# Patient Record
Sex: Female | Born: 1965 | Race: White | Hispanic: No | Marital: Single | Smoking: Never smoker
Health system: Southern US, Community
[De-identification: ages and names within clinical notes are randomized; demographics above are authoritative.]

## PROBLEM LIST (undated history)

## (undated) DIAGNOSIS — G4700 Insomnia, unspecified: Secondary | ICD-10-CM

## (undated) DIAGNOSIS — F32A Depression, unspecified: Secondary | ICD-10-CM

## (undated) DIAGNOSIS — E785 Hyperlipidemia, unspecified: Secondary | ICD-10-CM

## (undated) DIAGNOSIS — R1310 Dysphagia, unspecified: Secondary | ICD-10-CM

## (undated) DIAGNOSIS — G894 Chronic pain syndrome: Secondary | ICD-10-CM

## (undated) DIAGNOSIS — E041 Nontoxic single thyroid nodule: Secondary | ICD-10-CM

## (undated) DIAGNOSIS — I10 Essential (primary) hypertension: Secondary | ICD-10-CM

## (undated) DIAGNOSIS — K5900 Constipation, unspecified: Secondary | ICD-10-CM

## (undated) DIAGNOSIS — E0590 Thyrotoxicosis, unspecified without thyrotoxic crisis or storm: Secondary | ICD-10-CM

## (undated) DIAGNOSIS — G600 Hereditary motor and sensory neuropathy: Secondary | ICD-10-CM

## (undated) DIAGNOSIS — N2889 Other specified disorders of kidney and ureter: Secondary | ICD-10-CM

## (undated) DIAGNOSIS — G118 Other hereditary ataxias: Secondary | ICD-10-CM

## (undated) DIAGNOSIS — D649 Anemia, unspecified: Secondary | ICD-10-CM

## (undated) HISTORY — PX: GREAT TOE ARTHRODESIS, INTERPHALANGEAL JOINT: SUR55

## (undated) HISTORY — PX: ORIF HIP FRACTURE: SHX2125

## (undated) HISTORY — PX: HERNIA REPAIR: SHX51

---

## 2009-09-29 ENCOUNTER — Ambulatory Visit: Payer: Self-pay | Admitting: Family Medicine

## 2009-09-29 DIAGNOSIS — R531 Weakness: Secondary | ICD-10-CM

## 2009-10-02 ENCOUNTER — Encounter: Payer: Self-pay | Admitting: Family Medicine

## 2009-10-03 LAB — CONVERTED CEMR LAB
Albumin: 4.4 g/dL (ref 3.5–5.2)
Chloride: 102 meq/L (ref 96–112)
Creatinine, Ser: 0.7 mg/dL (ref 0.40–1.20)
HCT: 39.2 % (ref 36.0–46.0)
HDL: 62 mg/dL (ref >39)
Hemoglobin: 12.4 g/dL (ref 12.0–15.0)
LDL Cholesterol: 87 mg/dL (ref 0–99)
MCHC: 31.6 g/dL (ref 30.0–36.0)
Platelets: 286 10*3/uL (ref 150–400)
Potassium: 4.4 meq/L (ref 3.5–5.3)
RDW: 13 % (ref 11.5–15.5)
Sodium: 138 meq/L (ref 135–145)
TSH: 0.671 microintl units/mL (ref 0.350–4.500)

## 2009-10-22 ENCOUNTER — Encounter: Admission: RE | Admit: 2009-10-22 | Discharge: 2009-10-22 | Payer: Self-pay | Admitting: Family Medicine

## 2009-10-27 ENCOUNTER — Ambulatory Visit: Payer: Self-pay | Admitting: Family Medicine

## 2009-10-27 ENCOUNTER — Other Ambulatory Visit: Admission: RE | Admit: 2009-10-27 | Discharge: 2009-10-27 | Payer: Self-pay | Admitting: Family Medicine

## 2009-10-30 LAB — CONVERTED CEMR LAB
Pap Smear: NEGATIVE
Pap Smear: NORMAL

## 2009-11-14 ENCOUNTER — Encounter: Payer: Self-pay | Admitting: Family Medicine

## 2009-11-14 DIAGNOSIS — A5217 General paresis: Secondary | ICD-10-CM

## 2009-12-16 ENCOUNTER — Encounter: Payer: Self-pay | Admitting: Family Medicine

## 2010-02-12 ENCOUNTER — Encounter: Payer: Self-pay | Admitting: Family Medicine

## 2010-03-31 NOTE — Assessment & Plan Note (Signed)
Summary: NOV: LE weakness   Vital Signs:  Patient profile:   45 year old female Height:      65 inches Weight:      112 pounds BMI:     18.71 Pulse rate:   81 / minute BP sitting:   135 / 80  (left arm) Cuff size:   regular  Vitals Entered By: Avon Gully CMA, Duncan Dull) (September 29, 2009 2:23 PM) CC: NP est care   Primary Care Provider:  Nani Gasser MD  CC:  NP est care.  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 Older brother was dx with this.  Can use crutches. Able to do bathing.  Haing a harder time with that. ABle to go to the kitchen table.  If gets out uses hr wheelchair.  3 of 5 siblings have this d/o.  Lives with her parents. but they are intheir 70s.  Does do some leg stretches. started using a cane in her late 66s. She is here with her sister today.   Hs been having regular periods.  Never been sexually active.  Never had a mammogram.  Occ gets dizzy with her periods.  Decreased appetite.  Has lost some weight recently.   Habits & Providers  Alcohol-Tobacco-Diet     Alcohol drinks/day: 0     Tobacco Status: never  Exercise-Depression-Behavior     Does Patient Exercise: no     STD Risk: never     Drug Use: no     Seat Belt Use: always  Current Medications (verified): 1)  Multivitamins  Tabs (Multiple Vitamin) .Marland Kitchen.. 1 Tablet By Mouth Daily 2)  Vitamin D3 400 Unit Tabs (Cholecalciferol) .... On E Tablet By Mouth Daily 3)  Vitamin B-12 100 Mcg Tabs (Cyanocobalamin) .... Once Daily  Allergies (verified): No Known Drug Allergies  Comments:  Nurse/Medical Assistant: The patient's medications and allergies were reviewed with the patient and were updated in the Medication and Allergy Lists. Avon Gully CMA, Duncan Dull) (September 29, 2009 2:28 PM)  Past History:  Past Medical History: None  Past Surgical History: surgery to lengthen toes, 1980s Hernia early 68s   Family History: Grandparent wth MI, DM Father with Hi chol,  HTN Brothe wtih hi chol, HTN.  2 brothers with Charcot Alen Bleacher  Social History: Completed 10thgrade. Single.  Never Smoked Alcohol use-no Drug use-no Regular exercise-no Smoking Status:  never STD Risk:  never Seat Belt Use:  always Drug Use:  no Does Patient Exercise:  no  Review of Systems       No fever/sweats/weakness, unexplained weight loss/gain.  No vison changes.  No difficulty hearing/ringing in ears, hay fever/allergies.  No chest pain/discomfort, palpitations.  No Br lump/nipple discharge.  No cough/wheeze.  No blood in BM, nausea/vomiting/diarrhea.  No nighttime urination, leaking urine, unusual vaginal bleeding, discharge (penis or vagina).  No muscle/joint pain. No rash, change in mole.  No HA, memory loss.  No anxiety, sleep d/o, depression.  + easy bruising/bleeding, unexplained lump   Physical Exam  General:  Well-developed,well-nourished,in no acute distress; alert,appropriate and cooperative throughout examination Head:  Normocephalic and atraumatic without obvious abnormalities. No apparent alopecia or balding. Lungs:  Normal respiratory effort, chest expands symmetrically. Lungs are clear to auscultation, no crackles or wheezes. Heart:  Normal rate and regular rhythm. S1 and S2 normal without gallop, murmur, click, rub or other extra sounds. Msk:  UE with NROM and strength. LE with Hip adn knee strength 2/5 with the left hip  and knee being weaker. Starting to develope a contract in teh left leg. Had to push to get her left knee flexed again.  Unable to extend the ankles. Can flex weakly.  Atropy of the leg muscles bilat.   Extremities:  No LE edema.  Skin:  no rashes.   Psych:  Cognition and judgment appear intact. Alert and cooperative with normal attention span and concentration. 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  Their are no treatments for this and her brother is now in a nursing home. Discussed that we could refer to Neurology for further evaluation. Also discussed that PT would be helpful for her. Encouraged her to do her exercises daily.   Also recommen screening baseline labs.  Pt will think about Neuro referral.  Also recommend she get her mammogram as has never had one.  Orders: T-CBC No Diff ([id number]) T-Comprehensive Metabolic Panel ([id number]) T-TSH [id number]) T-Lipid Profile ([id number])  Complete Medication List: 1)  Multivitamins Tabs (Multiple vitamin) .Marland Kitchen.. 1 tablet by mouth daily 2)  Vitamin D3 400 Unit Tabs (Cholecalciferol) .... On e tablet by mouth daily 3)  Vitamin B-12 100 Mcg Tabs (Cyanocobalamin) .... Once daily  Patient Instructions: 1)  Please schedule a physical with me.   2)  We will call your lab results.  3)  Think about the Physical Therapy and Neurology referral

## 2010-03-31 NOTE — Assessment & Plan Note (Signed)
Summary: CPE with Pap   Vital Signs:  Patient profile:   45 year old female LMP:     10/23/2009 Height:      65 inches Pulse rate:   82 / minute BP sitting:   119 / 75  (left arm) Cuff size:   regular  Vitals Entered By: Kathlene November (October 27, 2009 9:30 AM) CC: CPE with pap LMP (date): 10/23/2009     Enter LMP: 10/23/2009   Primary Care Provider:  Nani Gasser MD  CC:  CPE with pap.  History of Present Illness: Here for CPE today.  Has normal periods.  WEnt for her mammogram last week and did well.   Says she is not interested in seeing a Neurologist at this time. She thinks she would like to have homePT.   Current Medications (verified): 1)  Multivitamins  Tabs (Multiple Vitamin) .Marland Kitchen.. 1 Tablet By Mouth Daily 2)  Vitamin D3 400 Unit Tabs (Cholecalciferol) .... On E Tablet By Mouth Daily 3)  Vitamin B-12 100 Mcg Tabs (Cyanocobalamin) .... Once Daily  Allergies (verified): No Known Drug Allergies  Comments:  Nurse/Medical Assistant: The patient's medications and allergies were reviewed with the patient and were updated in the Medication and Allergy Lists. Kathlene November (October 27, 2009 9:31 AM)  Past History:  Past Medical History: Last updated: 09/29/2009 None  Past Surgical History: Last updated: 09/29/2009 surgery to lengthen toes, 1980s Hernia early 46s   Family History: Last updated: 09/29/2009 Grandparent wth MI, DM Father with Hi chol, HTN Brothe wtih hi chol, HTN.  2 brothers with Charcot Alen Bleacher  Social History: Last updated: 09/29/2009 Completed 10thgrade. Single.  Never Smoked Alcohol use-no Drug use-no Regular exercise-no  Review of Systems  The patient denies anorexia, fever, weight loss, weight gain, vision loss, decreased hearing, hoarseness, chest pain, syncope, dyspnea on exertion, peripheral edema, prolonged cough, headaches, hemoptysis, abdominal pain, melena, hematochezia, severe indigestion/heartburn, hematuria,  incontinence, genital sores, muscle weakness, suspicious skin lesions, transient blindness, difficulty walking, depression, unusual weight change, abnormal bleeding, enlarged lymph nodes, and breast masses.    Physical Exam  General:  Well-developed,well-nourished,in no acute distress; alert,appropriate and cooperative throughout examination Head:  Normocephalic and atraumatic without obvious abnormalities. No apparent alopecia or balding. Eyes:  No corneal or conjunctival inflammation noted. EOMI. Perrla. Ears:  External ear exam shows no significant lesions or deformities.  Otoscopic examination reveals clear canals, tympanic membranes are intact bilaterally without bulging, retraction, inflammation or discharge. Hearing is grossly normal bilaterally. Nose:  External nasal examination shows no deformity or inflammation.  Mouth:  Oral mucosa and oropharynx without lesions or exudates.  Teeth in good repair. Neck:  No deformities, masses, or tenderness noted. Chest Wall:  No deformities, masses, or tenderness noted. Breasts:  No mass, nodules, thickening, tenderness, bulging, retraction, inflamation, nipple discharge or skin changes noted.   Lungs:  Normal respiratory effort, chest expands symmetrically. Lungs are clear to auscultation, no crackles or wheezes. Heart:  Normal rate and regular rhythm. S1 and S2 normal without gallop, murmur, click, rub or other extra sounds. Abdomen:  Bowel sounds positive,abdomen soft and non-tender without masses, organomegaly or hernias noted. Genitalia:  normal introitus, no external lesions, no vaginal discharge, mucosa pink and moist, no vaginal atrophy, and no adnexal masses or tenderness.  Uterus felt slightly enlarged.  Msk:  No deformity or scoliosis noted of thoracic or lumbar spine.   Extremities:  No clubbing, cyanosis, edema, or deformity noted with normal full range of motion of all  joints.   Neurologic:  No cranial nerve deficits noted. Station and  gait are normal.  Sensory, motor and coordinative functions appear intact. Skin:  no rashes.   Cervical Nodes:  No lymphadenopathy noted Axillary Nodes:  No palpable lymphadenopathy Psych:  Cognition and judgment appear intact. Alert and cooperative with normal attention span and concentration. No apparent delusions, illusions, hallucinations   Impression & Recommendations:  Problem # 1:  ROUTINE GYNECOLOGICAL EXAMINATION (ICD-V72.31) Mammogram up to date Discussed Tdap and flu shot F/U pap results. HPV added. She is not sexually active.  If normal then consider [place] of teh uterus to evaluate for enlarged size.  REviewede her lab results.   Complete Medication List: 1)  Multivitamins Tabs (Multiple vitamin) .Marland Kitchen.. 1 tablet by mouth daily 2)  Vitamin D3 400 Unit Tabs (Cholecalciferol) .... On e tablet by mouth daily 3)  Vitamin B-12 100 Mcg Tabs (Cyanocobalamin) .... Once daily  Other Orders: Physical Therapy Referral (PT)  Patient Instructions: 1)  Still think about getting home Physical Therapy.  2)  We will call you with your pap smear results.   Appended Document: CPE with Pap

## 2010-03-31 NOTE — Miscellaneous (Signed)
Summary: Care Plan/[facility]  Care Plan/[facility]   Imported By: Lanelle Bal 12/25/2009 11:19:28  _____________________________________________________________________  External Attachment:    Type:   Image     Comment:   External Document

## 2010-03-31 NOTE — Miscellaneous (Signed)
Summary: Face to Face Encounter Form/[facility]  Face to Face Encounter Form/[facility]   Imported By: Lanelle Bal 11/26/2009 14:05:20  _____________________________________________________________________  External Attachment:    Type:   Image     Comment:   External Document

## 2010-04-02 NOTE — Miscellaneous (Signed)
Summary: Face to Face Encounter Form/[facility]  Face to Face Encounter Form/[facility]   Imported By: Lanelle Bal 02/17/2010 09:37:51  _____________________________________________________________________  External Attachment:    Type:   Image     Comment:   External Document

## 2013-10-22 DIAGNOSIS — G1111 Friedreich ataxia: Secondary | ICD-10-CM | POA: Diagnosis not present

## 2014-11-11 ENCOUNTER — Other Ambulatory Visit: Payer: Self-pay | Admitting: Family Medicine

## 2014-11-11 ENCOUNTER — Encounter: Payer: Self-pay | Admitting: Family Medicine

## 2014-11-11 ENCOUNTER — Ambulatory Visit (INDEPENDENT_AMBULATORY_CARE_PROVIDER_SITE_OTHER): Payer: Medicare Other | Admitting: Family Medicine

## 2014-11-11 VITALS — BP 121/64 | HR 79 | Temp 97.8°F

## 2014-11-11 DIAGNOSIS — R531 Weakness: Secondary | ICD-10-CM | POA: Diagnosis not present

## 2014-11-11 DIAGNOSIS — G118 Other hereditary ataxias: Secondary | ICD-10-CM

## 2014-11-11 DIAGNOSIS — G600 Hereditary motor and sensory neuropathy: Secondary | ICD-10-CM

## 2014-11-11 DIAGNOSIS — Z13220 Encounter for screening for lipoid disorders: Secondary | ICD-10-CM

## 2014-11-11 DIAGNOSIS — Z23 Encounter for immunization: Secondary | ICD-10-CM

## 2014-11-11 DIAGNOSIS — H547 Unspecified visual loss: Secondary | ICD-10-CM | POA: Insufficient documentation

## 2014-11-11 DIAGNOSIS — D509 Iron deficiency anemia, unspecified: Secondary | ICD-10-CM | POA: Diagnosis not present

## 2014-11-11 DIAGNOSIS — Z8249 Family history of ischemic heart disease and other diseases of the circulatory system: Secondary | ICD-10-CM | POA: Diagnosis not present

## 2014-11-11 NOTE — Progress Notes (Signed)
Subjective:    Patient ID: Sharon Bruce, female    DOB: 01-18-1966, 49 y.o.   MRN: [id number]  HPI  Sharon Bruce is here to reestablish care today. I last saw her 5 years ago. 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 She's at the point where she is having difficulty caring for her. Fact they actually brought in [facility] 2 forms to be completed. She does take a few supplements including B12, B6 and folic acid as well as a multivitamin. She reports that her last mammogram was about 2 years ago done in [place]. Her last Pap smear was about 5 years ago.  She is primarily will chair bound. She can take maybe 2 steps with some assistance. She is not able to bear weight on her legs on her own. She is able to dress herself most days but does seem to need assistance occasionally reticulated and putting on her underwear. She does have especially outfitted handicap shower with bars and hand-held shower head. She is able to shower and clean independently. She is able to eat independently but does require food preparation by her mother. At home she does use a manual wheelchair which she propels herself. She reports normal bladder and bowel function. She has good strength with her hands bilaterally but occasionally experiences sharp cramping in the hands and has to use her opposite hand to open her fist.  She does have an occasional dry cough which seems to be worse after she eats. This has been long-term and is felt to be secondary to her disease processes.   Review of Systems  Constitutional: Negative for fever, diaphoresis and unexpected weight change.  HENT: Negative for hearing loss, rhinorrhea and tinnitus.   Eyes: Negative for visual disturbance.  Respiratory: Negative for cough and wheezing.   Cardiovascular: Negative for chest pain and palpitations.  Gastrointestinal: Negative for nausea,  vomiting, diarrhea and blood in stool.  Genitourinary: Negative for vaginal bleeding, vaginal discharge and difficulty urinating.  Musculoskeletal: Negative for myalgias and arthralgias.  Skin: Negative for rash.  Neurological: Negative for headaches.  Hematological: Negative for adenopathy. Does not bruise/bleed easily.  Psychiatric/Behavioral: Negative for sleep disturbance and dysphoric mood. The patient is not nervous/anxious.    BP 121/64 mmHg  Pulse 79  Temp(Src) 97.8 F (36.6 C)  Wt   LMP 11/04/2014    No Known Allergies  History reviewed. No pertinent past medical history.  Past Surgical History  Procedure Laterality Date  . Hernia repair      as a child, groin hernia    Social History   Social History  . Marital Status: Single    Spouse Name: N/A  . Number of Children: N/A  . Years of Education: 10th grade   Occupational History  . unemployed    Social History Main Topics  . Smoking status: Never Smoker   . Smokeless tobacco: Not on file  . Alcohol Use: No  . Drug Use: No  . Sexual Activity: No   Other Topics Concern  . Not on file   Social History Narrative   Wheelchair bound. Disabled.      Family History  Problem Relation Age of Onset  . Hypertension Father   . Hypertension Brother   . Charcot-Marie-Tooth disease Brother   . Heart disease Father     Outpatient Encounter Prescriptions as of 11/11/2014  Medication Sig  . B Complex  Vitamins (B COMPLEX PO) Take by mouth.  . Cholecalciferol (VITAMIN D3) 1000 UNITS CAPS Take 1,000 Units by mouth.  . Multiple Vitamin (VITAMIN E/FOLIC ACID/B-6/B-12) CAPS Take by mouth.   No facility-administered encounter medications on file as of 11/11/2014.           Objective:   Physical Exam  Constitutional: She is oriented to person, place, and time. She appears well-developed and well-nourished.  Sitting in wheelchair  HENT:  Head: Normocephalic and atraumatic.  Right Ear: External ear normal.   Left Ear: External ear normal.  Nose: Nose normal.  Mouth/Throat: Oropharynx is clear and moist.  TMs and canals are clear.   Eyes: Conjunctivae and EOM are normal. Pupils are equal, round, and reactive to light.  Neck: Neck supple. No thyromegaly present.  Cardiovascular: Normal rate, regular rhythm and normal heart sounds.   Pulmonary/Chest: Effort normal and breath sounds normal. She has no wheezes.  Lymphadenopathy:    She has no cervical adenopathy.  Neurological: She is alert and oriented to person, place, and time.  Skin: Skin is warm and dry.  Psychiatric: She has a normal mood and affect. Her behavior is normal.          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 She is wheelchair-bound. [place] for her in case her mother is not able to take care of her in the near future which is very possible. As her mom is aging out and having a more difficult struggles being a primary caretaker for her.  Poor vision-will refer to optometry for further evaluation. 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 It is been years per her report since she was actually seen by a vision specialist.  Due for mammogram. She says she will plan to schedule along with her sister's visit in [place] has special equipment that can be used.  Discussed need for tetanus vaccine She prefers to  hold off today as she is wanting to do the flu for now.  Discuss getting a CMP and lipid panel. She plans to schedule another mammogram in [place] so that her sister can go at the same time. It sounds like they may have some special equipment to allow her to do her mammogram without having to stand.  Flu vaccine given today.

## 2014-11-12 LAB — COMPLETE METABOLIC PANEL WITH GFR
ALT: 11 U/L (ref 6–29)
AST: 13 U/L (ref 10–35)
Albumin: 4 g/dL (ref 3.6–5.1)
Alkaline Phosphatase: 52 U/L (ref 33–115)
BILIRUBIN TOTAL: 1 mg/dL (ref 0.2–1.2)
BUN: 11 mg/dL (ref 7–25)
CHLORIDE: 102 mmol/L (ref 98–110)
CO2: 29 mmol/L (ref 20–31)
Calcium: 9.4 mg/dL (ref 8.6–10.2)
Creat: 0.67 mg/dL (ref 0.50–1.10)
GLUCOSE: 87 mg/dL (ref 65–99)
POTASSIUM: 4.3 mmol/L (ref 3.5–5.3)
SODIUM: 140 mmol/L (ref 135–146)
TOTAL PROTEIN: 6.7 g/dL (ref 6.1–8.1)

## 2014-11-12 LAB — LIPID PANEL
CHOL/HDL RATIO: 2.6 ratio (ref <=5.0)
Cholesterol: 164 mg/dL (ref 125–200)
HDL: 62 mg/dL (ref >=46)
LDL CALC: 91 mg/dL (ref <130)
TRIGLYCERIDES: 54 mg/dL (ref <150)
VLDL: 11 mg/dL (ref <30)

## 2014-11-12 LAB — CBC
HCT: 36.5 % (ref 36.0–46.0)
HEMOGLOBIN: 11.9 g/dL — AB (ref 12.0–15.0)
MCH: 29.3 pg (ref 26.0–34.0)
MCHC: 32.6 g/dL (ref 30.0–36.0)
MCV: 89.9 fL (ref 78.0–100.0)
MPV: 10.4 fL (ref 8.6–12.4)
Platelets: 298 10*3/uL (ref 150–400)
RBC: 4.06 MIL/uL (ref 3.87–5.11)
RDW: 13.2 % (ref 11.5–15.5)
WBC: 6.8 10*3/uL (ref 4.0–10.5)

## 2014-11-12 LAB — VITAMIN D 25 HYDROXY (VIT D DEFICIENCY, FRACTURES): VIT D 25 HYDROXY: 29 ng/mL — AB (ref 30–100)

## 2014-11-13 LAB — VITAMIN B12: VITAMIN B 12: 899 pg/mL (ref 211–911)

## 2014-11-13 LAB — FERRITIN: Ferritin: 16 ng/mL (ref 10–291)

## 2014-11-28 ENCOUNTER — Telehealth: Payer: Self-pay

## 2014-11-28 NOTE — Telephone Encounter (Signed)
Patient is calling about a FL2 form. Has it been sent.

## 2014-12-28 DIAGNOSIS — Z9181 History of falling: Secondary | ICD-10-CM | POA: Diagnosis not present

## 2014-12-28 DIAGNOSIS — S7291XA Unspecified fracture of right femur, initial encounter for closed fracture: Secondary | ICD-10-CM | POA: Diagnosis not present

## 2014-12-28 DIAGNOSIS — R293 Abnormal posture: Secondary | ICD-10-CM | POA: Diagnosis not present

## 2014-12-28 DIAGNOSIS — R269 Unspecified abnormalities of gait and mobility: Secondary | ICD-10-CM | POA: Diagnosis not present

## 2014-12-28 DIAGNOSIS — S72001A Fracture of unspecified part of neck of right femur, initial encounter for closed fracture: Secondary | ICD-10-CM

## 2014-12-28 DIAGNOSIS — S72011A Unspecified intracapsular fracture of right femur, initial encounter for closed fracture: Secondary | ICD-10-CM | POA: Diagnosis present

## 2014-12-28 DIAGNOSIS — G3281 Cerebellar ataxia in diseases classified elsewhere: Secondary | ICD-10-CM | POA: Diagnosis not present

## 2014-12-28 DIAGNOSIS — R8271 Bacteriuria: Secondary | ICD-10-CM | POA: Diagnosis not present

## 2014-12-28 DIAGNOSIS — Z7901 Long term (current) use of anticoagulants: Secondary | ICD-10-CM | POA: Diagnosis not present

## 2014-12-28 DIAGNOSIS — S72091A Other fracture of head and neck of right femur, initial encounter for closed fracture: Secondary | ICD-10-CM | POA: Diagnosis not present

## 2014-12-28 DIAGNOSIS — W0110XA Fall on same level from slipping, tripping and stumbling with subsequent striking against unspecified object, initial encounter: Secondary | ICD-10-CM | POA: Diagnosis not present

## 2014-12-28 DIAGNOSIS — D649 Anemia, unspecified: Secondary | ICD-10-CM | POA: Diagnosis present

## 2014-12-28 DIAGNOSIS — G118 Other hereditary ataxias: Secondary | ICD-10-CM | POA: Diagnosis not present

## 2014-12-28 DIAGNOSIS — R29898 Other symptoms and signs involving the musculoskeletal system: Secondary | ICD-10-CM | POA: Diagnosis not present

## 2014-12-28 DIAGNOSIS — Z993 Dependence on wheelchair: Secondary | ICD-10-CM | POA: Diagnosis not present

## 2014-12-28 DIAGNOSIS — Z96641 Presence of right artificial hip joint: Secondary | ICD-10-CM | POA: Diagnosis not present

## 2014-12-28 DIAGNOSIS — S72101A Unspecified trochanteric fracture of right femur, initial encounter for closed fracture: Secondary | ICD-10-CM | POA: Diagnosis not present

## 2014-12-28 DIAGNOSIS — R1312 Dysphagia, oropharyngeal phase: Secondary | ICD-10-CM | POA: Diagnosis not present

## 2014-12-28 DIAGNOSIS — G111 Early-onset cerebellar ataxia: Secondary | ICD-10-CM | POA: Diagnosis not present

## 2014-12-28 DIAGNOSIS — M25551 Pain in right hip: Secondary | ICD-10-CM | POA: Diagnosis not present

## 2014-12-28 DIAGNOSIS — M6281 Muscle weakness (generalized): Secondary | ICD-10-CM | POA: Diagnosis not present

## 2014-12-28 HISTORY — PX: HEMIARTHROPLASTY (BIPOLAR), HIP, DIRECT ANTERIOR APPROACH, FOR FRACTURE: SHX7584

## 2014-12-28 HISTORY — DX: Fracture of unspecified part of neck of right femur, initial encounter for closed fracture: S72.001A

## 2015-01-03 NOTE — Progress Notes (Signed)
 [facility] Physical Therapy - Treatment  Patient Name:  Sharon Bruce [facility] Date of Birth:  May 08, 1965  Today's Date: January 03, 2015  Assessment / Plan   Clinical Assessment: Carry over at 20-30% accuracy for cognition and motor cues/strategies by patient. Sister present and actively engaged with concurrent education on modifying movement patterns of UEs and LEs; patient is very responsive to verbal and tactile cues for minimizing tone and increased motor control/coordination. Pt. responds well to purposful movements, activity play, rythmic motion, and music (provided by sister on smart device) Pt. tolerates extended activity time and is limited by R hip pain with LE activities/exercises. PT. would benefit from IP rehab to maximize functional independence and lessen burden of care. PT Problem List: Decline in ambulation;Decline in safety awareness;Decreased ADL function;Decreased functional mobility;Decreased activity tolerance;Decreased handling tolerance;Fall risk;Generalized weakness;Impaired posture;Impaired balance;Impaired sensory processing Prognosis: Fair;Good     Recommendations  PT Recommendations: SNF Rehab consult;24 hour supervision/assist;IP Rehab facility consult PT Equipment Recommended: Hospital Bed  Pertinent Information: Pt. Side-lying in bed with pillow positioned between flexed knees. Positional changes encouraged with family and staff. Pt. Has all needs within reach. Mother and sister are present in room for additional needs. Subjective  Pt. with RN/CNA for pericare. Sister present and given permission to discuss PT by patient. Pt. is agreeable to PT session   Objective  Precautions  Hip Precautions: Anterior (direct anterior (no precautions indicated)) Other Precautions: Ant. Hip/tone/extensor patterns Precautions discussed with:: Patient;Parent(s);Support person  Pain 2 on a scale of 10.  Location: Right  hip Interventions Treatment Interventions: Cognitive Retraining;Functional Therapeutic activity;Patient/caregiver education;Bed Mobility Training;Miscellaneous Neuromuscular re-education: Muscle tone management;Gross motor coordination;Trunk control;Crossing midline activities;Bilateral coordination;PNF patterns Sensory Integration: Dynamic joint positioning;Static joint positioning     Goals  PT - Patient/Support Person Stated Goals: Pt. falling asleep at end of session; family present, friend present.     Physical Therapy Care Plan       Problem: Balance, Impaired    Dates: Start: 01/01/15      Goal: Balance    Dates: Start: 01/01/15   Expected End: 01/08/15     Description: Patient will maintain sitting while propped on the UE with good stability for up to 5 minutes with light assistance and prepositioning.     Outcomes: Date/Time User Outcome  01/02/15 1615 Adine JONELLE Sharps, PTA Progressing        Problem: Mobility - Impaired    Dates: Start: 01/01/15      Goal: Bed Mobility    Dates: Start: 01/01/15   Expected End: 01/08/15     Description: Patient will perform bed mobility with rolling to the left side and assisting to push up to sit with mod A.    Outcomes: Date/Time User Outcome  01/02/15 1615 Adine JONELLE Sharps, PTA Progressing       Goal: Transfers    Dates: Start: 01/01/15   Expected End: 01/08/15     Description: Patient will perform bed to chair transfer with mod A of one person.    Outcomes: Date/Time User Outcome  01/02/15 1615 Adine JONELLE Sharps, PTA Progressing          Key (I=independent, ModI=modified independent, S=supervision, CGA=contact guard assist, Min=minimal assist, Mod=moderate assist, Max=maximal assist, D= dependent)   Treatment Time  Today's Treatment: 1258 - 1353 Total Time: 55 min Treatment Day: 3   Charges  Total Time Code Treatment Minutes: 55  Therapeutic Charges $ Neuromuscular Re-education: 4 units  Adine JONELLE Sharps,  PTA  01/03/2015 3:04 PM

## 2015-01-04 DIAGNOSIS — R29898 Other symptoms and signs involving the musculoskeletal system: Secondary | ICD-10-CM | POA: Diagnosis not present

## 2015-01-04 DIAGNOSIS — G111 Early-onset cerebellar ataxia: Secondary | ICD-10-CM | POA: Diagnosis not present

## 2015-01-04 DIAGNOSIS — G119 Hereditary ataxia, unspecified: Secondary | ICD-10-CM | POA: Diagnosis not present

## 2015-01-04 DIAGNOSIS — M6281 Muscle weakness (generalized): Secondary | ICD-10-CM | POA: Diagnosis not present

## 2015-01-04 DIAGNOSIS — Z96641 Presence of right artificial hip joint: Secondary | ICD-10-CM | POA: Diagnosis not present

## 2015-01-04 DIAGNOSIS — Z7901 Long term (current) use of anticoagulants: Secondary | ICD-10-CM | POA: Diagnosis not present

## 2015-01-04 DIAGNOSIS — R1310 Dysphagia, unspecified: Secondary | ICD-10-CM | POA: Diagnosis not present

## 2015-01-04 DIAGNOSIS — K5900 Constipation, unspecified: Secondary | ICD-10-CM | POA: Diagnosis not present

## 2015-01-04 DIAGNOSIS — G118 Other hereditary ataxias: Secondary | ICD-10-CM | POA: Diagnosis not present

## 2015-01-04 DIAGNOSIS — R293 Abnormal posture: Secondary | ICD-10-CM | POA: Diagnosis not present

## 2015-01-04 DIAGNOSIS — E440 Moderate protein-calorie malnutrition: Secondary | ICD-10-CM | POA: Diagnosis not present

## 2015-01-04 DIAGNOSIS — R8271 Bacteriuria: Secondary | ICD-10-CM | POA: Diagnosis not present

## 2015-01-04 DIAGNOSIS — R269 Unspecified abnormalities of gait and mobility: Secondary | ICD-10-CM | POA: Diagnosis not present

## 2015-01-04 DIAGNOSIS — D649 Anemia, unspecified: Secondary | ICD-10-CM | POA: Diagnosis not present

## 2015-01-04 DIAGNOSIS — R1312 Dysphagia, oropharyngeal phase: Secondary | ICD-10-CM | POA: Diagnosis not present

## 2015-01-04 DIAGNOSIS — Z9181 History of falling: Secondary | ICD-10-CM | POA: Diagnosis not present

## 2015-01-04 DIAGNOSIS — S72001A Fracture of unspecified part of neck of right femur, initial encounter for closed fracture: Secondary | ICD-10-CM | POA: Diagnosis not present

## 2015-01-04 DIAGNOSIS — S31815A Open bite of right buttock, initial encounter: Secondary | ICD-10-CM | POA: Diagnosis not present

## 2015-01-04 DIAGNOSIS — G3281 Cerebellar ataxia in diseases classified elsewhere: Secondary | ICD-10-CM | POA: Diagnosis not present

## 2015-01-04 DIAGNOSIS — S72101A Unspecified trochanteric fracture of right femur, initial encounter for closed fracture: Secondary | ICD-10-CM | POA: Diagnosis not present

## 2015-01-05 DIAGNOSIS — D649 Anemia, unspecified: Secondary | ICD-10-CM | POA: Diagnosis not present

## 2015-01-05 DIAGNOSIS — G119 Hereditary ataxia, unspecified: Secondary | ICD-10-CM | POA: Diagnosis not present

## 2015-01-05 DIAGNOSIS — R1310 Dysphagia, unspecified: Secondary | ICD-10-CM | POA: Diagnosis not present

## 2015-01-05 DIAGNOSIS — S31815A Open bite of right buttock, initial encounter: Secondary | ICD-10-CM | POA: Diagnosis not present

## 2015-01-05 DIAGNOSIS — K5900 Constipation, unspecified: Secondary | ICD-10-CM | POA: Diagnosis not present

## 2015-01-05 DIAGNOSIS — E440 Moderate protein-calorie malnutrition: Secondary | ICD-10-CM | POA: Diagnosis not present

## 2015-01-27 ENCOUNTER — Telehealth: Payer: Self-pay

## 2015-01-27 NOTE — Telephone Encounter (Signed)
Patient's mom called and states they still having received the FL2 form. Please advise.   [phone] [facility].

## 2015-01-27 NOTE — Telephone Encounter (Signed)
Patient's mom advised 

## 2015-01-27 NOTE — Telephone Encounter (Signed)
I am not sure.  I was completed when she was here in September. I am not sure when and where it was faxed. I'll be happy to fill out another form if we cannot locate it. They can drop it off anytime.

## 2015-03-03 DIAGNOSIS — G3281 Cerebellar ataxia in diseases classified elsewhere: Secondary | ICD-10-CM | POA: Diagnosis not present

## 2015-03-03 DIAGNOSIS — E43 Unspecified severe protein-calorie malnutrition: Secondary | ICD-10-CM | POA: Diagnosis not present

## 2015-03-26 DIAGNOSIS — M25551 Pain in right hip: Secondary | ICD-10-CM | POA: Diagnosis not present

## 2015-06-05 DIAGNOSIS — E039 Hypothyroidism, unspecified: Secondary | ICD-10-CM | POA: Diagnosis not present

## 2015-07-02 DIAGNOSIS — M79672 Pain in left foot: Secondary | ICD-10-CM | POA: Diagnosis not present

## 2015-07-02 DIAGNOSIS — M79671 Pain in right foot: Secondary | ICD-10-CM | POA: Diagnosis not present

## 2015-07-02 DIAGNOSIS — B351 Tinea unguium: Secondary | ICD-10-CM | POA: Diagnosis not present

## 2015-07-18 DIAGNOSIS — E039 Hypothyroidism, unspecified: Secondary | ICD-10-CM | POA: Diagnosis not present

## 2015-07-24 DIAGNOSIS — H04123 Dry eye syndrome of bilateral lacrimal glands: Secondary | ICD-10-CM | POA: Diagnosis not present

## 2015-07-24 DIAGNOSIS — H2513 Age-related nuclear cataract, bilateral: Secondary | ICD-10-CM | POA: Diagnosis not present

## 2015-08-05 ENCOUNTER — Other Ambulatory Visit: Payer: Self-pay | Admitting: Nurse Practitioner

## 2015-08-05 DIAGNOSIS — Z1231 Encounter for screening mammogram for malignant neoplasm of breast: Secondary | ICD-10-CM

## 2015-08-11 DIAGNOSIS — M810 Age-related osteoporosis without current pathological fracture: Secondary | ICD-10-CM | POA: Diagnosis not present

## 2015-08-15 ENCOUNTER — Ambulatory Visit
Admission: RE | Admit: 2015-08-15 | Discharge: 2015-08-15 | Disposition: A | Discharge status: Home / Self Care | Payer: Medicare Other | Source: Ambulatory Visit | Attending: Nurse Practitioner | Admitting: Nurse Practitioner

## 2015-08-15 ENCOUNTER — Other Ambulatory Visit: Payer: Self-pay | Admitting: Internal Medicine

## 2015-08-15 DIAGNOSIS — Z1231 Encounter for screening mammogram for malignant neoplasm of breast: Secondary | ICD-10-CM

## 2015-10-01 DIAGNOSIS — M79672 Pain in left foot: Secondary | ICD-10-CM | POA: Diagnosis not present

## 2015-10-01 DIAGNOSIS — B351 Tinea unguium: Secondary | ICD-10-CM | POA: Diagnosis not present

## 2015-10-01 DIAGNOSIS — M79671 Pain in right foot: Secondary | ICD-10-CM | POA: Diagnosis not present

## 2016-07-07 ENCOUNTER — Other Ambulatory Visit: Payer: Self-pay | Admitting: Internal Medicine

## 2016-07-07 DIAGNOSIS — Z1231 Encounter for screening mammogram for malignant neoplasm of breast: Secondary | ICD-10-CM

## 2016-08-16 ENCOUNTER — Ambulatory Visit
Admission: RE | Admit: 2016-08-16 | Discharge: 2016-08-16 | Disposition: A | Discharge status: Home / Self Care | Payer: Medicare Other | Source: Ambulatory Visit | Attending: Internal Medicine | Admitting: Internal Medicine

## 2016-08-16 ENCOUNTER — Encounter: Payer: Self-pay | Admitting: Radiology

## 2016-08-16 DIAGNOSIS — Z1231 Encounter for screening mammogram for malignant neoplasm of breast: Secondary | ICD-10-CM

## 2016-09-29 DIAGNOSIS — B351 Tinea unguium: Secondary | ICD-10-CM | POA: Diagnosis not present

## 2016-09-29 DIAGNOSIS — L603 Nail dystrophy: Secondary | ICD-10-CM | POA: Diagnosis not present

## 2016-09-29 DIAGNOSIS — I739 Peripheral vascular disease, unspecified: Secondary | ICD-10-CM | POA: Diagnosis not present

## 2016-10-14 DIAGNOSIS — H04123 Dry eye syndrome of bilateral lacrimal glands: Secondary | ICD-10-CM | POA: Diagnosis not present

## 2016-10-14 DIAGNOSIS — H2513 Age-related nuclear cataract, bilateral: Secondary | ICD-10-CM | POA: Diagnosis not present

## 2017-07-14 ENCOUNTER — Other Ambulatory Visit: Payer: Self-pay | Admitting: Internal Medicine

## 2017-07-14 DIAGNOSIS — Z1231 Encounter for screening mammogram for malignant neoplasm of breast: Secondary | ICD-10-CM

## 2017-08-17 ENCOUNTER — Ambulatory Visit: Payer: Medicare Other

## 2017-10-25 ENCOUNTER — Ambulatory Visit: Payer: Medicare Other

## 2017-11-21 ENCOUNTER — Ambulatory Visit: Payer: Medicare Other

## 2017-12-14 ENCOUNTER — Ambulatory Visit
Admission: RE | Admit: 2017-12-14 | Discharge: 2017-12-14 | Disposition: A | Discharge status: Home / Self Care | Payer: Medicare Other | Source: Ambulatory Visit | Attending: Internal Medicine | Admitting: Internal Medicine

## 2017-12-14 DIAGNOSIS — Z1231 Encounter for screening mammogram for malignant neoplasm of breast: Secondary | ICD-10-CM

## 2018-12-22 ENCOUNTER — Other Ambulatory Visit: Payer: Self-pay | Admitting: Internal Medicine

## 2018-12-22 DIAGNOSIS — Z1231 Encounter for screening mammogram for malignant neoplasm of breast: Secondary | ICD-10-CM

## 2019-02-13 ENCOUNTER — Ambulatory Visit: Payer: Medicare Other

## 2019-05-07 ENCOUNTER — Ambulatory Visit
Admission: RE | Admit: 2019-05-07 | Discharge: 2019-05-07 | Disposition: A | Discharge status: Home / Self Care | Payer: Medicare Other | Source: Ambulatory Visit | Attending: Internal Medicine | Admitting: Internal Medicine

## 2019-05-07 ENCOUNTER — Other Ambulatory Visit: Payer: Self-pay

## 2019-05-07 DIAGNOSIS — Z1231 Encounter for screening mammogram for malignant neoplasm of breast: Secondary | ICD-10-CM

## 2019-05-09 ENCOUNTER — Other Ambulatory Visit: Payer: Self-pay | Admitting: Internal Medicine

## 2019-05-09 DIAGNOSIS — R928 Other abnormal and inconclusive findings on diagnostic imaging of breast: Secondary | ICD-10-CM

## 2019-05-23 ENCOUNTER — Other Ambulatory Visit: Payer: Self-pay

## 2019-05-23 ENCOUNTER — Ambulatory Visit
Admission: RE | Admit: 2019-05-23 | Discharge: 2019-05-23 | Disposition: A | Discharge status: Home / Self Care | Payer: Medicare Other | Source: Ambulatory Visit | Attending: Internal Medicine | Admitting: Internal Medicine

## 2019-05-23 DIAGNOSIS — R928 Other abnormal and inconclusive findings on diagnostic imaging of breast: Secondary | ICD-10-CM

## 2020-04-17 ENCOUNTER — Other Ambulatory Visit: Payer: Self-pay | Admitting: Internal Medicine

## 2020-04-17 DIAGNOSIS — Z1231 Encounter for screening mammogram for malignant neoplasm of breast: Secondary | ICD-10-CM

## 2020-06-06 ENCOUNTER — Ambulatory Visit: Payer: Medicare Other

## 2020-06-06 ENCOUNTER — Inpatient Hospital Stay: Admission: RE | Admit: 2020-06-06 | Payer: Medicare Other | Source: Ambulatory Visit

## 2020-08-11 ENCOUNTER — Other Ambulatory Visit: Payer: Self-pay

## 2020-08-11 ENCOUNTER — Ambulatory Visit
Admission: RE | Admit: 2020-08-11 | Discharge: 2020-08-11 | Disposition: A | Discharge status: Home / Self Care | Payer: Medicare Other | Source: Ambulatory Visit | Attending: Internal Medicine | Admitting: Internal Medicine

## 2020-08-11 DIAGNOSIS — Z1231 Encounter for screening mammogram for malignant neoplasm of breast: Secondary | ICD-10-CM

## 2020-08-12 ENCOUNTER — Ambulatory Visit: Payer: Medicare Other

## 2023-03-05 IMAGING — MG MM DIGITAL SCREENING BILAT W/ TOMO AND CAD
8 series · 8 of 24 positions shown · non-contrast
Comparison: Previous exam(s).

CLINICAL DATA: Screening.

EXAM:
DIGITAL SCREENING BILATERAL MAMMOGRAM WITH TOMOSYNTHESIS AND CAD
TECHNIQUE: Bilateral screening digital craniocaudal and mediolateral oblique
mammograms were obtained. Bilateral screening digital breast
tomosynthesis was performed. The images were evaluated with
computer-aided detection.

[L MLO synth-2D]
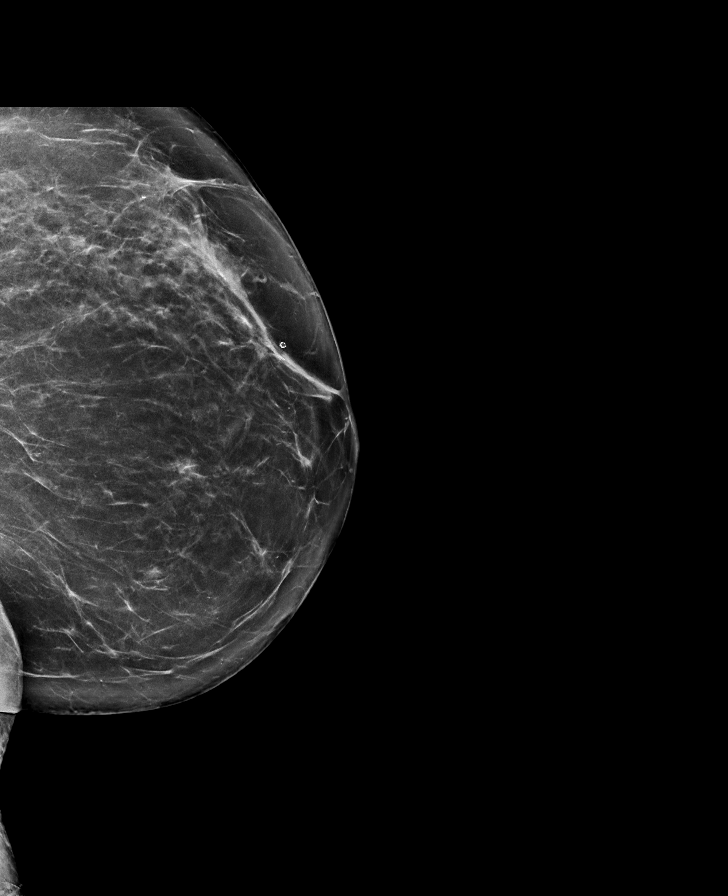

[L CC synth-2D]
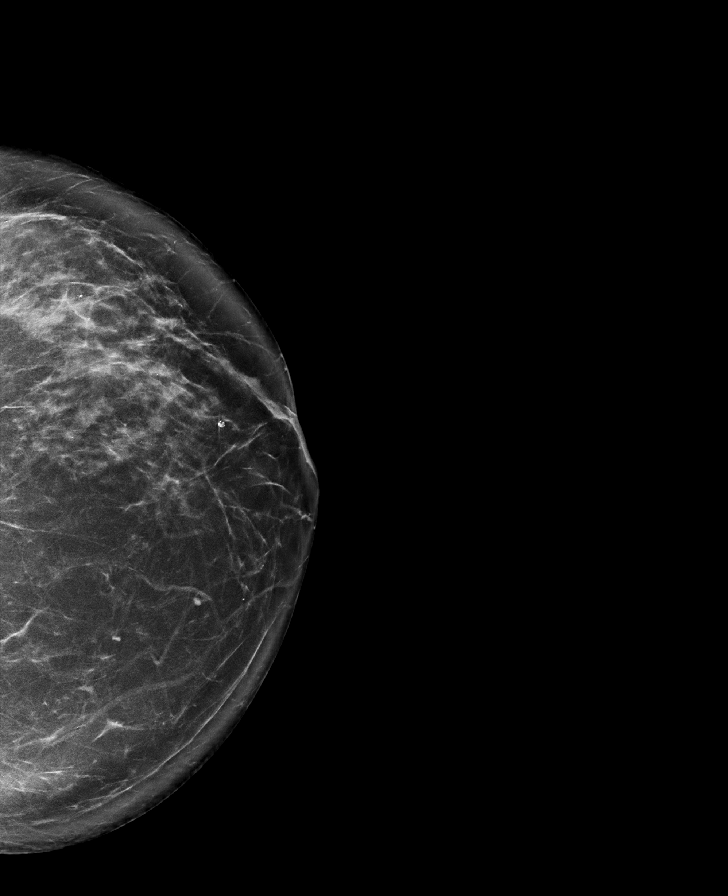

[R MLO synth-2D]
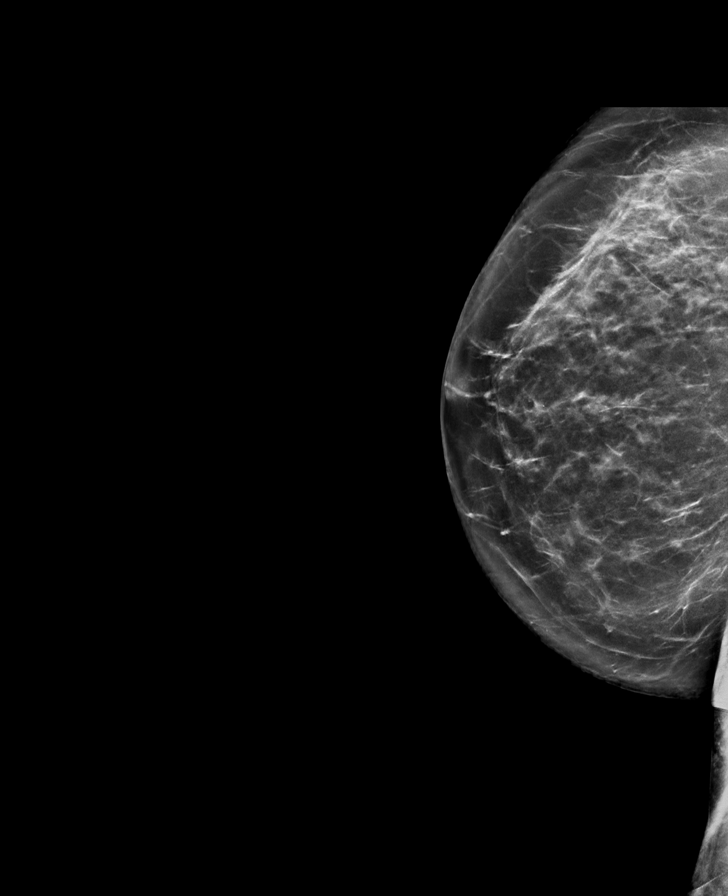

[R CC synth-2D]
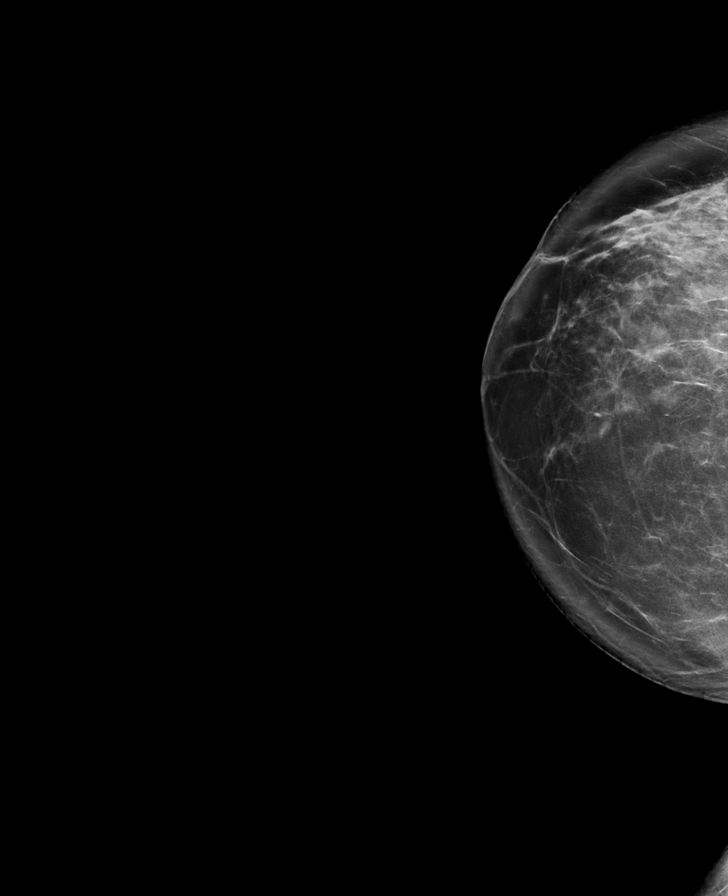

[L CC tomo · tomo slice 41/82.0]
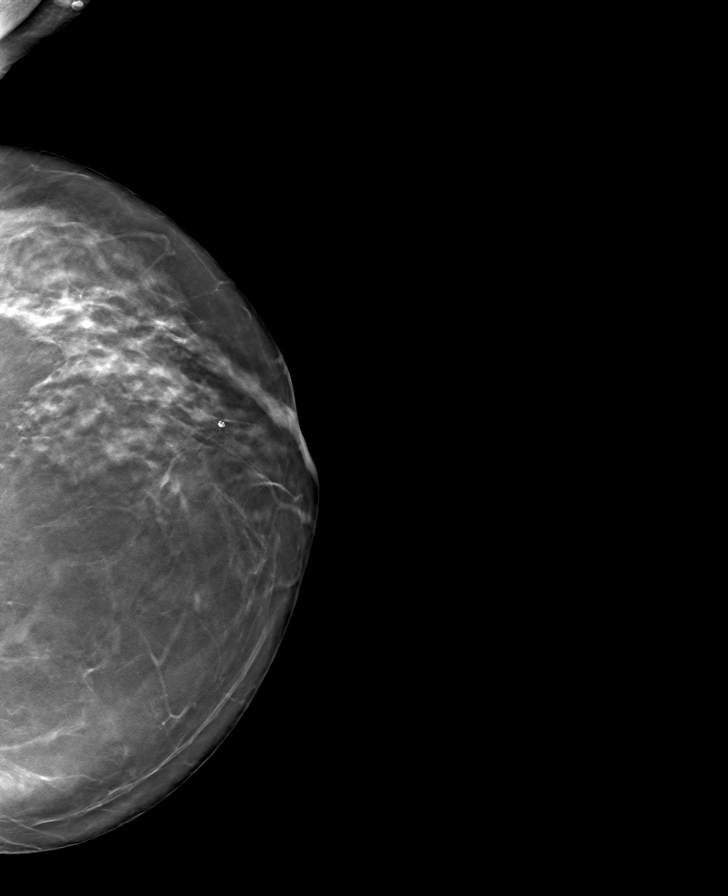

[R CC tomo · tomo slice 43/84.0]
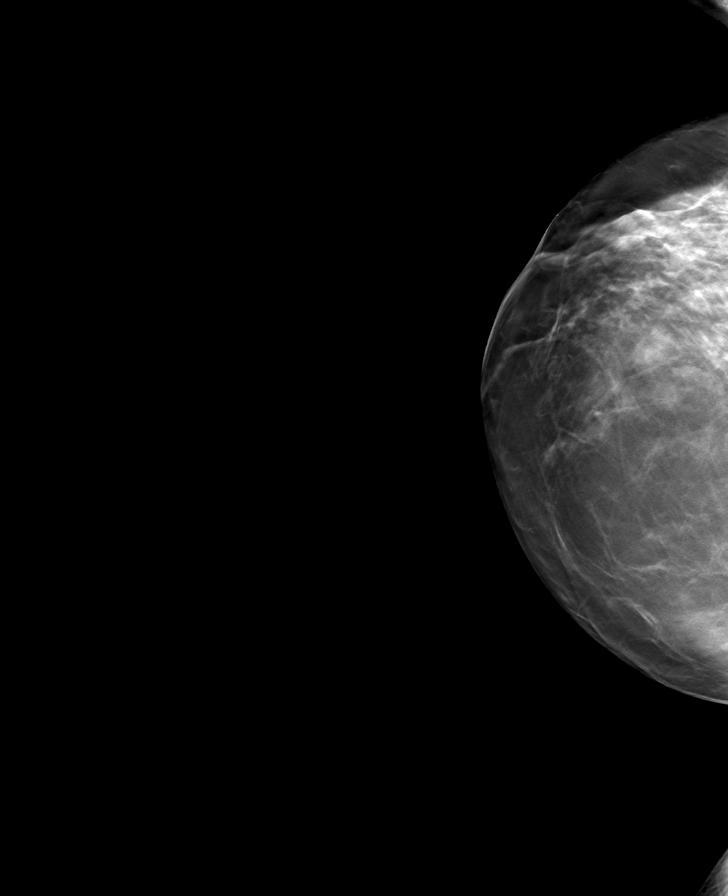

[L MLO tomo · tomo slice 42/83.0]
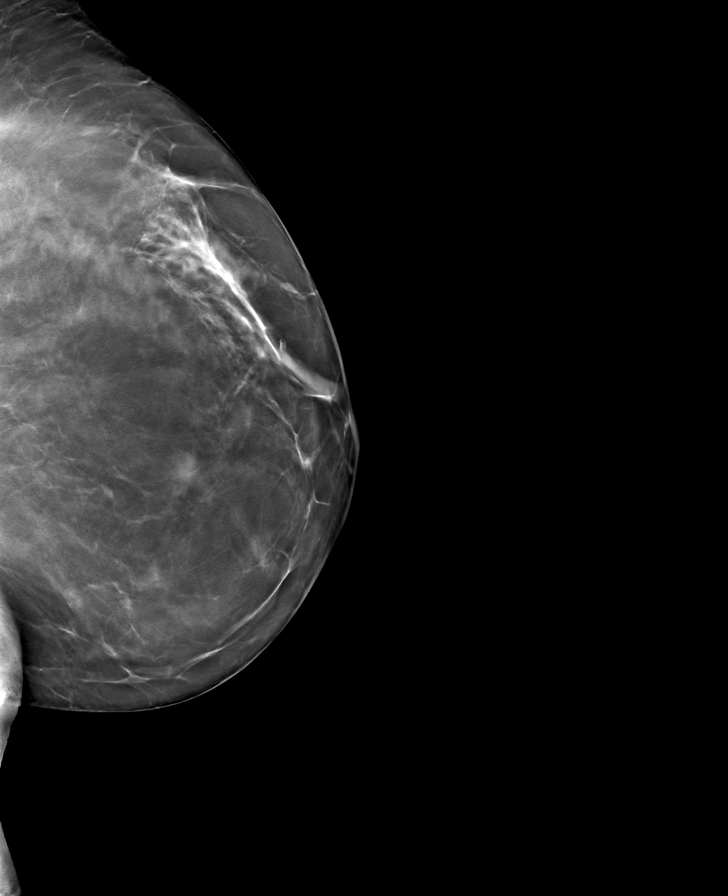

[R MLO tomo · tomo slice 41/80.0]
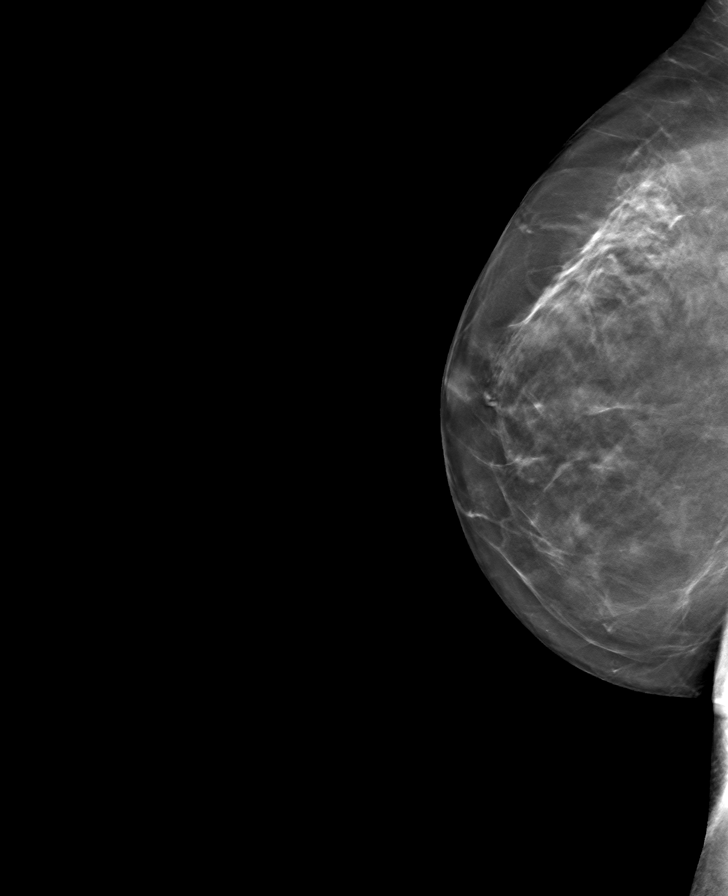

[8 of 24 positions shown; findings below may reference images not displayed]

ACR Breast Density Category c: The breast tissue is heterogeneously
dense, which may obscure small masses.
FINDINGS: There are no findings suspicious for malignancy. The images were
evaluated with computer-aided detection.
IMPRESSION: No mammographic evidence of malignancy. A result letter of this
screening mammogram will be mailed directly to the patient.

RECOMMENDATION:
Screening mammogram in one year. (Code:T4-5-GWO)

BI-RADS CATEGORY  1: Negative.

## 2023-10-25 ENCOUNTER — Observation Stay (HOSPITAL_COMMUNITY)

## 2023-10-25 ENCOUNTER — Observation Stay (HOSPITAL_COMMUNITY): Admitting: Anesthesiology

## 2023-10-25 ENCOUNTER — Encounter (HOSPITAL_COMMUNITY): Payer: Self-pay

## 2023-10-25 ENCOUNTER — Other Ambulatory Visit: Payer: Self-pay

## 2023-10-25 ENCOUNTER — Emergency Department (HOSPITAL_COMMUNITY)

## 2023-10-25 ENCOUNTER — Inpatient Hospital Stay (HOSPITAL_COMMUNITY)
Admission: EM | Admit: 2023-10-25 | Discharge: 2023-10-28 | DRG: 854 | Disposition: A | Discharge status: Skilled Nursing Facility | Source: Skilled Nursing Facility | Attending: Internal Medicine | Admitting: Internal Medicine

## 2023-10-25 ENCOUNTER — Encounter (HOSPITAL_COMMUNITY)
Admission: EM | Disposition: A | Discharge status: Skilled Nursing Facility | Payer: Self-pay | Source: Skilled Nursing Facility | Attending: Internal Medicine

## 2023-10-25 DIAGNOSIS — N200 Calculus of kidney: Secondary | ICD-10-CM

## 2023-10-25 DIAGNOSIS — G118 Other hereditary ataxias: Secondary | ICD-10-CM | POA: Diagnosis present

## 2023-10-25 DIAGNOSIS — E039 Hypothyroidism, unspecified: Secondary | ICD-10-CM | POA: Diagnosis present

## 2023-10-25 DIAGNOSIS — N390 Urinary tract infection, site not specified: Secondary | ICD-10-CM | POA: Diagnosis not present

## 2023-10-25 DIAGNOSIS — E785 Hyperlipidemia, unspecified: Secondary | ICD-10-CM | POA: Diagnosis not present

## 2023-10-25 DIAGNOSIS — Z66 Do not resuscitate: Secondary | ICD-10-CM | POA: Diagnosis present

## 2023-10-25 DIAGNOSIS — Z87442 Personal history of urinary calculi: Secondary | ICD-10-CM

## 2023-10-25 DIAGNOSIS — N3001 Acute cystitis with hematuria: Principal | ICD-10-CM

## 2023-10-25 DIAGNOSIS — I10 Essential (primary) hypertension: Secondary | ICD-10-CM | POA: Diagnosis present

## 2023-10-25 DIAGNOSIS — Z87440 Personal history of urinary (tract) infections: Secondary | ICD-10-CM

## 2023-10-25 DIAGNOSIS — A4151 Sepsis due to Escherichia coli [E. coli]: Secondary | ICD-10-CM | POA: Diagnosis not present

## 2023-10-25 DIAGNOSIS — N202 Calculus of kidney with calculus of ureter: Secondary | ICD-10-CM | POA: Diagnosis present

## 2023-10-25 DIAGNOSIS — J9811 Atelectasis: Secondary | ICD-10-CM | POA: Diagnosis present

## 2023-10-25 DIAGNOSIS — A419 Sepsis, unspecified organism: Secondary | ICD-10-CM | POA: Diagnosis not present

## 2023-10-25 DIAGNOSIS — R4182 Altered mental status, unspecified: Secondary | ICD-10-CM

## 2023-10-25 DIAGNOSIS — E0590 Thyrotoxicosis, unspecified without thyrotoxic crisis or storm: Secondary | ICD-10-CM | POA: Diagnosis present

## 2023-10-25 DIAGNOSIS — Z791 Long term (current) use of non-steroidal anti-inflammatories (NSAID): Secondary | ICD-10-CM

## 2023-10-25 DIAGNOSIS — N2889 Other specified disorders of kidney and ureter: Secondary | ICD-10-CM

## 2023-10-25 DIAGNOSIS — Z1612 Extended spectrum beta lactamase (ESBL) resistance: Secondary | ICD-10-CM | POA: Diagnosis present

## 2023-10-25 DIAGNOSIS — Z7401 Bed confinement status: Secondary | ICD-10-CM

## 2023-10-25 DIAGNOSIS — N201 Calculus of ureter: Secondary | ICD-10-CM

## 2023-10-25 DIAGNOSIS — C642 Malignant neoplasm of left kidney, except renal pelvis: Secondary | ICD-10-CM | POA: Diagnosis present

## 2023-10-25 DIAGNOSIS — G600 Hereditary motor and sensory neuropathy: Secondary | ICD-10-CM | POA: Diagnosis present

## 2023-10-25 DIAGNOSIS — E669 Obesity, unspecified: Secondary | ICD-10-CM | POA: Diagnosis present

## 2023-10-25 DIAGNOSIS — J90 Pleural effusion, not elsewhere classified: Secondary | ICD-10-CM | POA: Diagnosis present

## 2023-10-25 DIAGNOSIS — G121 Other inherited spinal muscular atrophy: Secondary | ICD-10-CM | POA: Diagnosis present

## 2023-10-25 DIAGNOSIS — E876 Hypokalemia: Secondary | ICD-10-CM | POA: Diagnosis present

## 2023-10-25 DIAGNOSIS — N136 Pyonephrosis: Secondary | ICD-10-CM | POA: Diagnosis present

## 2023-10-25 DIAGNOSIS — R0902 Hypoxemia: Secondary | ICD-10-CM | POA: Diagnosis present

## 2023-10-25 DIAGNOSIS — Z79899 Other long term (current) drug therapy: Secondary | ICD-10-CM

## 2023-10-25 DIAGNOSIS — Z8249 Family history of ischemic heart disease and other diseases of the circulatory system: Secondary | ICD-10-CM

## 2023-10-25 DIAGNOSIS — H547 Unspecified visual loss: Secondary | ICD-10-CM

## 2023-10-25 DIAGNOSIS — E079 Disorder of thyroid, unspecified: Secondary | ICD-10-CM

## 2023-10-25 HISTORY — DX: Sepsis, unspecified organism: A41.9

## 2023-10-25 HISTORY — DX: Sepsis, unspecified organism: N39.0

## 2023-10-25 HISTORY — DX: Thyrotoxicosis, unspecified without thyrotoxic crisis or storm: E05.90

## 2023-10-25 HISTORY — DX: Hyperlipidemia, unspecified: E78.5

## 2023-10-25 HISTORY — PX: CYSTOSCOPY W/ URETERAL STENT PLACEMENT: SHX1429

## 2023-10-25 HISTORY — DX: Essential (primary) hypertension: I10

## 2023-10-25 HISTORY — DX: Hereditary motor and sensory neuropathy: G60.0

## 2023-10-25 LAB — CBC WITH DIFFERENTIAL/PLATELET
Abs Immature Granulocytes: 0.06 K/uL (ref 0.00–0.07)
Basophils Absolute: 0 K/uL (ref 0.0–0.1)
Basophils Relative: 0 %
Eosinophils Absolute: 0 K/uL (ref 0.0–0.5)
Eosinophils Relative: 0 %
HCT: 33.1 % — ABNORMAL LOW (ref 36.0–46.0)
Hemoglobin: 10.2 g/dL — ABNORMAL LOW (ref 12.0–15.0)
Immature Granulocytes: 1 %
Lymphocytes Relative: 4 %
Lymphs Abs: 0.5 K/uL — ABNORMAL LOW (ref 0.7–4.0)
MCH: 29 pg (ref 26.0–34.0)
MCHC: 30.8 g/dL (ref 30.0–36.0)
MCV: 94 fL (ref 80.0–100.0)
Monocytes Absolute: 0.9 K/uL (ref 0.1–1.0)
Monocytes Relative: 8 %
Neutro Abs: 10.9 K/uL — ABNORMAL HIGH (ref 1.7–7.7)
Neutrophils Relative %: 87 %
Platelets: 248 K/uL (ref 150–400)
RBC: 3.52 MIL/uL — ABNORMAL LOW (ref 3.87–5.11)
RDW: 15.3 % (ref 11.5–15.5)
WBC: 12.5 K/uL — ABNORMAL HIGH (ref 4.0–10.5)
nRBC: 0 % (ref 0.0–0.2)

## 2023-10-25 LAB — COMPREHENSIVE METABOLIC PANEL WITH GFR
ALT: 9 U/L (ref 0–44)
AST: 11 U/L — ABNORMAL LOW (ref 15–41)
Albumin: 2.5 g/dL — ABNORMAL LOW (ref 3.5–5.0)
Alkaline Phosphatase: 62 U/L (ref 38–126)
Anion gap: 9 (ref 5–15)
BUN: 15 mg/dL (ref 6–20)
CO2: 20 mmol/L — ABNORMAL LOW (ref 22–32)
Calcium: 7.2 mg/dL — ABNORMAL LOW (ref 8.9–10.3)
Chloride: 112 mmol/L — ABNORMAL HIGH (ref 98–111)
Creatinine, Ser: 0.36 mg/dL — ABNORMAL LOW (ref 0.44–1.00)
GFR, Estimated: >60 mL/min (ref >60)
Glucose, Bld: 101 mg/dL — ABNORMAL HIGH (ref 70–99)
Potassium: 2.8 mmol/L — ABNORMAL LOW (ref 3.5–5.1)
Sodium: 141 mmol/L (ref 135–145)
Total Bilirubin: 0.8 mg/dL (ref 0.0–1.2)
Total Protein: 4.6 g/dL — ABNORMAL LOW (ref 6.5–8.1)

## 2023-10-25 LAB — I-STAT CG4 LACTIC ACID, ED: Lactic Acid, Venous: 0.6 mmol/L (ref 0.5–1.9)

## 2023-10-25 LAB — URINALYSIS, W/ REFLEX TO CULTURE (INFECTION SUSPECTED)
Bilirubin Urine: NEGATIVE
Glucose, UA: NEGATIVE mg/dL
Ketones, ur: >80 mg/dL — AB
Nitrite: POSITIVE — AB
Protein, ur: 100 mg/dL — AB
RBC / HPF: >50 RBC/hpf (ref 0–5)
Specific Gravity, Urine: 1.025 (ref 1.005–1.030)
WBC, UA: >50 WBC/hpf (ref 0–5)
pH: 6 (ref 5.0–8.0)

## 2023-10-25 LAB — PROTIME-INR
INR: 1 (ref 0.8–1.2)
Prothrombin Time: 14.1 s (ref 11.4–15.2)

## 2023-10-25 LAB — RESP PANEL BY RT-PCR (RSV, FLU A&B, COVID)  RVPGX2
Influenza A by PCR: NEGATIVE
Influenza B by PCR: NEGATIVE
Resp Syncytial Virus by PCR: NEGATIVE
SARS Coronavirus 2 by RT PCR: NEGATIVE

## 2023-10-25 LAB — MRSA NEXT GEN BY PCR, NASAL: MRSA by PCR Next Gen: NOT DETECTED

## 2023-10-25 LAB — TSH: TSH: 0.884 u[IU]/mL (ref 0.350–4.500)

## 2023-10-25 LAB — HIV ANTIBODY (ROUTINE TESTING W REFLEX): HIV Screen 4th Generation wRfx: NONREACTIVE

## 2023-10-25 SURGERY — CYSTOSCOPY, WITH RETROGRADE PYELOGRAM AND URETERAL STENT INSERTION
Anesthesia: General | Site: Ureter | Laterality: Bilateral

## 2023-10-25 MED ORDER — SODIUM CHLORIDE 0.9% FLUSH: 3.0000 mL | INTRAVENOUS | Status: DC | PRN

## 2023-10-25 MED ORDER — ACETAMINOPHEN 10 MG/ML IV SOLN: 1000.0000 mg | Freq: Once | INTRAVENOUS | Status: DC | PRN

## 2023-10-25 MED ORDER — BACLOFEN 20 MG PO TABS: 20.0000 mg | ORAL_TABLET | Freq: Three times a day (TID) | ORAL | Status: DC

## 2023-10-25 MED ORDER — OXYCODONE HCL 5 MG/5ML PO SOLN: 5.0000 mg | Freq: Once | ORAL | Status: DC | PRN

## 2023-10-25 MED ORDER — FENTANYL CITRATE (PF) 100 MCG/2ML IJ SOLN
INTRAMUSCULAR | Status: DC | PRN
  Administered 2023-10-25: 75 ug via INTRAVENOUS

## 2023-10-25 MED ORDER — IOHEXOL 350 MG/ML SOLN
100.0000 mL | Freq: Once | INTRAVENOUS | Status: AC | PRN
  Administered 2023-10-25: 100 mL via INTRAVENOUS

## 2023-10-25 MED ORDER — SODIUM CHLORIDE 0.9 % IV SOLN
500.0000 mg | Freq: Once | INTRAVENOUS | Status: AC
  Administered 2023-10-25: 500 mg via INTRAVENOUS
  Filled 2023-10-25: qty 5

## 2023-10-25 MED ORDER — ESCITALOPRAM OXALATE 10 MG PO TABS
10.0000 mg | ORAL_TABLET | Freq: Every day | ORAL | Status: DC
  Administered 2023-10-26 – 2023-10-28 (×3): 10 mg via ORAL
  Filled 2023-10-25 (×3): qty 1

## 2023-10-25 MED ORDER — ROCURONIUM BROMIDE 10 MG/ML (PF) SYRINGE
PREFILLED_SYRINGE | INTRAVENOUS | Status: AC
  Filled 2023-10-25: qty 10

## 2023-10-25 MED ORDER — HYDRALAZINE HCL 20 MG/ML IJ SOLN: 10.0000 mg | Freq: Four times a day (QID) | INTRAMUSCULAR | Status: DC | PRN

## 2023-10-25 MED ORDER — POTASSIUM CHLORIDE CRYS ER 20 MEQ PO TBCR
40.0000 meq | EXTENDED_RELEASE_TABLET | Freq: Once | ORAL | Status: AC
  Administered 2023-10-25: 40 meq via ORAL
  Filled 2023-10-25: qty 2

## 2023-10-25 MED ORDER — FENTANYL CITRATE (PF) 100 MCG/2ML IJ SOLN
INTRAMUSCULAR | Status: AC
  Filled 2023-10-25: qty 2

## 2023-10-25 MED ORDER — ACETAMINOPHEN 650 MG RE SUPP
650.0000 mg | Freq: Four times a day (QID) | RECTAL | Status: DC | PRN
Start: 2023-10-25 — End: 2023-10-28

## 2023-10-25 MED ORDER — MELATONIN 3 MG PO TABS
3.0000 mg | ORAL_TABLET | Freq: Every day | ORAL | Status: DC
Start: 2023-10-25 — End: 2023-10-28
  Administered 2023-10-26 – 2023-10-27 (×2): 3 mg via ORAL
  Filled 2023-10-25 (×3): qty 1

## 2023-10-25 MED ORDER — SODIUM CHLORIDE 0.9 % IR SOLN
Status: DC | PRN
  Administered 2023-10-25: 3000 mL

## 2023-10-25 MED ORDER — DROPERIDOL 2.5 MG/ML IJ SOLN: 0.6250 mg | Freq: Once | INTRAMUSCULAR | Status: DC | PRN

## 2023-10-25 MED ORDER — SODIUM CHLORIDE 0.9 % IV SOLN
2.0000 g | INTRAVENOUS | Status: DC
  Administered 2023-10-26 – 2023-10-27 (×2): 2 g via INTRAVENOUS
  Filled 2023-10-25 (×2): qty 20

## 2023-10-25 MED ORDER — BISACODYL 10 MG RE SUPP
10.0000 mg | RECTAL | Status: DC
  Filled 2023-10-25: qty 1

## 2023-10-25 MED ORDER — ONDANSETRON HCL 4 MG PO TABS: 4.0000 mg | ORAL_TABLET | Freq: Four times a day (QID) | ORAL | Status: DC | PRN

## 2023-10-25 MED ORDER — POTASSIUM CHLORIDE 10 MEQ/100ML IV SOLN
10.0000 meq | INTRAVENOUS | Status: AC
  Administered 2023-10-25 (×2): 10 meq via INTRAVENOUS
  Filled 2023-10-25 (×2): qty 100

## 2023-10-25 MED ORDER — ACETAMINOPHEN 500 MG PO TABS
1000.0000 mg | ORAL_TABLET | Freq: Once | ORAL | Status: AC
  Administered 2023-10-25: 1000 mg via ORAL
  Filled 2023-10-25: qty 2

## 2023-10-25 MED ORDER — ACETAMINOPHEN 325 MG PO TABS
650.0000 mg | ORAL_TABLET | Freq: Four times a day (QID) | ORAL | Status: DC | PRN
  Filled 2023-10-25: qty 2

## 2023-10-25 MED ORDER — SENNOSIDES-DOCUSATE SODIUM 8.6-50 MG PO TABS
2.0000 | ORAL_TABLET | Freq: Two times a day (BID) | ORAL | Status: DC
  Administered 2023-10-26 – 2023-10-28 (×4): 2 via ORAL
  Filled 2023-10-25 (×6): qty 2

## 2023-10-25 MED ORDER — SUCCINYLCHOLINE CHLORIDE 200 MG/10ML IV SOSY
PREFILLED_SYRINGE | INTRAVENOUS | Status: AC
  Filled 2023-10-25: qty 10

## 2023-10-25 MED ORDER — MIDAZOLAM HCL 2 MG/2ML IJ SOLN
INTRAMUSCULAR | Status: AC
Start: 2023-10-25 — End: 2023-10-25
  Filled 2023-10-25: qty 2

## 2023-10-25 MED ORDER — ENOXAPARIN SODIUM 40 MG/0.4ML IJ SOSY
40.0000 mg | PREFILLED_SYRINGE | INTRAMUSCULAR | Status: DC
  Administered 2023-10-26 – 2023-10-28 (×3): 40 mg via SUBCUTANEOUS
  Filled 2023-10-25 (×3): qty 0.4

## 2023-10-25 MED ORDER — POLYETHYLENE GLYCOL 3350 17 G PO PACK
17.0000 g | PACK | Freq: Every day | ORAL | Status: DC
  Administered 2023-10-26 – 2023-10-28 (×3): 17 g via ORAL
  Filled 2023-10-25 (×3): qty 1

## 2023-10-25 MED ORDER — ONDANSETRON HCL 4 MG/2ML IJ SOLN
4.0000 mg | Freq: Four times a day (QID) | INTRAMUSCULAR | Status: DC | PRN
  Administered 2023-10-25: 4 mg via INTRAVENOUS
  Filled 2023-10-25: qty 2

## 2023-10-25 MED ORDER — VITAMIN D (ERGOCALCIFEROL) 1.25 MG (50000 UNIT) PO CAPS
50000.0000 [IU] | ORAL_CAPSULE | ORAL | Status: DC
  Administered 2023-10-28: 50000 [IU] via ORAL
  Filled 2023-10-25: qty 1

## 2023-10-25 MED ORDER — ONDANSETRON HCL 4 MG/2ML IJ SOLN
INTRAMUSCULAR | Status: AC
  Filled 2023-10-25: qty 2

## 2023-10-25 MED ORDER — LACTULOSE 10 GM/15ML PO SOLN
20.0000 g | Freq: Every day | ORAL | Status: DC
  Administered 2023-10-26 – 2023-10-28 (×3): 20 g via ORAL
  Filled 2023-10-25 (×3): qty 30

## 2023-10-25 MED ORDER — DEXAMETHASONE SODIUM PHOSPHATE 10 MG/ML IJ SOLN
INTRAMUSCULAR | Status: AC
  Filled 2023-10-25: qty 1

## 2023-10-25 MED ORDER — POTASSIUM CHLORIDE 10 MEQ/100ML IV SOLN
10.0000 meq | INTRAVENOUS | Status: AC
  Administered 2023-10-25 – 2023-10-26 (×4): 10 meq via INTRAVENOUS
  Filled 2023-10-25 (×4): qty 100

## 2023-10-25 MED ORDER — SODIUM CHLORIDE 0.9 % IV SOLN
2.0000 g | Freq: Once | INTRAVENOUS | Status: AC
  Administered 2023-10-25: 2 g via INTRAVENOUS
  Filled 2023-10-25: qty 20

## 2023-10-25 MED ORDER — SODIUM CHLORIDE 0.9% FLUSH
3.0000 mL | Freq: Two times a day (BID) | INTRAVENOUS | Status: DC
  Administered 2023-10-25 – 2023-10-27 (×6): 3 mL via INTRAVENOUS

## 2023-10-25 MED ORDER — LACTATED RINGERS IV SOLN: INTRAVENOUS | Status: DC

## 2023-10-25 MED ORDER — PHENYLEPHRINE HCL-NACL 20-0.9 MG/250ML-% IV SOLN
INTRAVENOUS | Status: DC | PRN
  Administered 2023-10-25: 30 ug/min via INTRAVENOUS

## 2023-10-25 MED ORDER — METHIMAZOLE 5 MG PO TABS
5.0000 mg | ORAL_TABLET | Freq: Every day | ORAL | Status: DC
  Administered 2023-10-26 – 2023-10-28 (×3): 5 mg via ORAL
  Filled 2023-10-25 (×4): qty 1

## 2023-10-25 MED ORDER — DEXAMETHASONE SODIUM PHOSPHATE 10 MG/ML IJ SOLN
INTRAMUSCULAR | Status: DC | PRN
  Administered 2023-10-25: 10 mg via INTRAVENOUS

## 2023-10-25 MED ORDER — LACTATED RINGERS IV BOLUS
1000.0000 mL | Freq: Once | INTRAVENOUS | Status: AC
  Administered 2023-10-25: 1000 mL via INTRAVENOUS

## 2023-10-25 MED ORDER — LIDOCAINE HCL (PF) 2 % IJ SOLN
INTRAMUSCULAR | Status: AC
  Filled 2023-10-25: qty 5

## 2023-10-25 MED ORDER — SODIUM CHLORIDE (PF) 0.9 % IJ SOLN
INTRAMUSCULAR | Status: AC
  Filled 2023-10-25: qty 50

## 2023-10-25 MED ORDER — ATORVASTATIN CALCIUM 10 MG PO TABS
10.0000 mg | ORAL_TABLET | Freq: Every day | ORAL | Status: DC
  Administered 2023-10-26 – 2023-10-27 (×2): 10 mg via ORAL
  Filled 2023-10-25 (×2): qty 1

## 2023-10-25 MED ORDER — ACETAMINOPHEN 10 MG/ML IV SOLN
INTRAVENOUS | Status: DC | PRN
  Administered 2023-10-25: 1000 mg via INTRAVENOUS

## 2023-10-25 MED ORDER — PROPOFOL 500 MG/50ML IV EMUL
INTRAVENOUS | Status: DC | PRN
  Administered 2023-10-25: 125 ug/kg/min via INTRAVENOUS

## 2023-10-25 MED ORDER — SODIUM CHLORIDE 0.9 % IV SOLN
250.0000 mL | INTRAVENOUS | Status: AC | PRN
Start: 2023-10-25 — End: 2023-10-26

## 2023-10-25 MED ORDER — PROPOFOL 10 MG/ML IV BOLUS
INTRAVENOUS | Status: AC
  Filled 2023-10-25: qty 20

## 2023-10-25 MED ORDER — STERILE WATER FOR IRRIGATION IR SOLN
Status: DC | PRN
  Administered 2023-10-25: 1000 mL

## 2023-10-25 MED ORDER — LIDOCAINE HCL (CARDIAC) PF 100 MG/5ML IV SOSY
PREFILLED_SYRINGE | INTRAVENOUS | Status: DC | PRN
  Administered 2023-10-25: 100 mg via INTRAVENOUS

## 2023-10-25 MED ORDER — ACETAMINOPHEN 10 MG/ML IV SOLN
INTRAVENOUS | Status: AC
  Filled 2023-10-25: qty 100

## 2023-10-25 MED ORDER — IOHEXOL 300 MG/ML  SOLN
INTRAMUSCULAR | Status: DC | PRN
  Administered 2023-10-25: 17 mL

## 2023-10-25 MED ORDER — SUCCINYLCHOLINE CHLORIDE 200 MG/10ML IV SOSY
PREFILLED_SYRINGE | INTRAVENOUS | Status: DC | PRN
  Administered 2023-10-25: 100 mg via INTRAVENOUS

## 2023-10-25 MED ORDER — OXYCODONE HCL 5 MG PO TABS: 5.0000 mg | ORAL_TABLET | Freq: Once | ORAL | Status: DC | PRN

## 2023-10-25 MED ORDER — FENTANYL CITRATE PF 50 MCG/ML IJ SOSY: 25.0000 ug | PREFILLED_SYRINGE | INTRAMUSCULAR | Status: DC | PRN

## 2023-10-25 MED ORDER — FENTANYL CITRATE (PF) 100 MCG/2ML IJ SOLN
INTRAMUSCULAR | Status: AC
Start: 2023-10-25 — End: 2023-10-25
  Filled 2023-10-25: qty 2

## 2023-10-25 MED ORDER — SUGAMMADEX SODIUM 200 MG/2ML IV SOLN
INTRAVENOUS | Status: AC
  Filled 2023-10-25: qty 2

## 2023-10-25 MED ORDER — LACTATED RINGERS IV SOLN: INTRAVENOUS | Status: AC

## 2023-10-25 MED ORDER — PROPOFOL 10 MG/ML IV BOLUS
INTRAVENOUS | Status: DC | PRN
  Administered 2023-10-25: 50 mg via INTRAVENOUS
  Administered 2023-10-25: 100 mg via INTRAVENOUS

## 2023-10-25 MED ORDER — ONDANSETRON HCL 4 MG/2ML IJ SOLN
INTRAMUSCULAR | Status: DC | PRN
  Administered 2023-10-25: 4 mg via INTRAVENOUS

## 2023-10-25 MED ORDER — SIMETHICONE 80 MG PO CHEW
125.0000 mg | CHEWABLE_TABLET | Freq: Three times a day (TID) | ORAL | Status: DC
  Administered 2023-10-26 – 2023-10-28 (×7): 120 mg via ORAL
  Filled 2023-10-25 (×8): qty 2

## 2023-10-25 MED ORDER — ADULT MULTIVITAMIN W/MINERALS CH
1.0000 | ORAL_TABLET | Freq: Every day | ORAL | Status: DC
  Administered 2023-10-26 – 2023-10-28 (×3): 1 via ORAL
  Filled 2023-10-25 (×3): qty 1

## 2023-10-25 SURGICAL SUPPLY — 16 items
BAG URO CATCHER STRL LF (MISCELLANEOUS) ×1 IMPLANT
BASKET ZERO TIP NITINOL 2.4FR (BASKET) IMPLANT
CATH URETL OPEN END 6FR 70 (CATHETERS) IMPLANT
CLOTH BEACON ORANGE TIMEOUT ST (SAFETY) ×1 IMPLANT
GLOVE SURG LX STRL 7.5 STRW (GLOVE) ×1 IMPLANT
GOWN STRL REUS W/ TWL XL LVL3 (GOWN DISPOSABLE) ×1 IMPLANT
GUIDEWIRE ANG ZIPWIRE 038X150 (WIRE) ×1 IMPLANT
GUIDEWIRE STR DUAL SENSOR (WIRE) IMPLANT
KIT TURNOVER KIT A (KITS) ×1 IMPLANT
MANIFOLD NEPTUNE II (INSTRUMENTS) ×1 IMPLANT
PACK CYSTO (CUSTOM PROCEDURE TRAY) ×1 IMPLANT
STENT POLARIS 5FRX24 (STENTS) IMPLANT
STENT POLARIS 5FRX26 (STENTS) IMPLANT
TRAY FOLEY MTR SLVR 14FR STAT (SET/KITS/TRAYS/PACK) IMPLANT
TUBE PU 8FR 16IN ENFIT (TUBING) IMPLANT
TUBING CONNECTING 10 (TUBING) ×1 IMPLANT

## 2023-10-25 NOTE — ED Triage Notes (Signed)
 PT bib [facility]. Per EMS PT normally a&o x3 at baseline PT now lethargic and only nodding yes or no to questions.

## 2023-10-25 NOTE — Anesthesia Procedure Notes (Signed)
 Procedure Name: Intubation Date/Time: 10/25/2023 6:00 PM  Performed by: Landy Chip HERO, CRNAPre-anesthesia Checklist: Patient identified, Emergency Drugs available, Suction available and Patient being monitored Patient Re-evaluated:Patient Re-evaluated prior to induction Oxygen Delivery Method: Circle System Utilized Preoxygenation: Pre-oxygenation with 100% oxygen Induction Type: IV induction and Rapid sequence Laryngoscope Size: Glidescope and 4 Grade View: Grade II Tube type: Oral Tube size: 7.0 mm Number of attempts: 1 Airway Equipment and Method: Rigid stylet Placement Confirmation: ETT inserted through vocal cords under direct vision, positive ETCO2 and breath sounds checked- equal and bilateral Secured at: 20 cm Tube secured with: Tape Dental Injury: Teeth and Oropharynx as per pre-operative assessment

## 2023-10-25 NOTE — Hospital Course (Signed)
 Sharon Bruce

## 2023-10-25 NOTE — Brief Op Note (Signed)
 10/25/2023  6:40 PM  PATIENT:  Sharon Bruce  58 y.o. female  PRE-OPERATIVE DIAGNOSIS:  Bilateral ureteral stone  POST-OPERATIVE DIAGNOSIS:  Bilateral ureteral stone  PROCEDURE:  Procedure(s): CYSTOSCOPY, WITH RETROGRADE PYELOGRAM AND URETERAL STENT INSERTION (Bilateral)  SURGEON:  Surgeons and Role:    * Manny, Ricardo KATHEE Raddle., MD - Primary  PHYSICIAN ASSISTANT:   ASSISTANTS: none   ANESTHESIA:   general  EBL:  minimal   BLOOD ADMINISTERED:none  DRAINS: foley to gravity   LOCAL MEDICATIONS USED:  NONE  SPECIMEN:  No Specimen  DISPOSITION OF SPECIMEN:  N/A  COUNTS:  YES  TOURNIQUET:  * No tourniquets in log *  DICTATION: .Other Dictation: Dictation Number [id number]  PLAN OF CARE: Admit to inpatient   PATIENT DISPOSITION:  PACU - hemodynamically stable.   Delay start of Pharmacological VTE agent (>24hrs) due to surgical blood loss or risk of bleeding: yes

## 2023-10-25 NOTE — ED Notes (Signed)
 Pt off the floor to radiology

## 2023-10-25 NOTE — H&P (Signed)
 [facility] History and Physical  Sharon Bruce FMW:[id number] DOB: 06/01/1965 DOA: 10/25/2023  Referring physician: ED  PCP: Yolande Toribio MATSU, MD   Patient is coming from: Assisted living facility  Chief Complaint: Lethargy  HPI:  Patient is a 58 years old female with past medical history of muscular atrophy, hypertension, bedbound status, hyperlipidemia, hypothyroidism from assisted living facility with past medical history presented to the hospital with lethargy and generalized weakness more for 1 day.  Patient complained of nausea but no vomiting.  Denies any pain in the abdomen.  Patient is mostly bedbound secondary to muscular atrophy currently at assisted living facility.  Able to feed by herself but otherwise needs total care and has a wheelchair for ambulation.  Patient also complains of mild chills but denies any chest pain, shortness of breath.  As per the patient's sister at bedside she occasionally has some cough and aspiration but never had pneumonia in the past.  Patient was then brought into the hospital by EMS.  In the ED, patient was noted to be febrile with a temperature of 100.9 F and was tachycardic and slightly tachypneic.  Blood pressure was within normal range.  Labs were notable for WBC elevation at 12.5 with hemoglobin of 10.1 with neutrophilia.  BMP showed significant hypokalemia with potassium of 2.8 urinalysis showed nitrite positive more than 50 RBC and white cells.  Lactate of 0.6.  COVID influenza and RSV was negative.  CT angiogram of the chest was negative for PE.  Chest x-ray was negative for infiltrate.  CT scan of the abdomen and pelvis showed hypodense masslike lesion in the right kidney, cystic and solid mass in the superior pole of the left kidney consistent with renal cell carcinoma, calculus of the left ureteropelvic junction measuring around 1.2 cm without significant hydronephrosis and calculus in the right renal pelvis.  Blood cultures and  urine cultures were sent from the ED and patient was considered for admission to the hospital for further evaluation and treatment.  Patient received Rocephin  and Zithromax  Ringer lactate bolus, potassium p.o. and was started on IV fluids in the ED.  Urology was consulted from the ED and patient was admitted hospital for further evaluation and treatment.  Assessment and Plan Principal Problem:   Sepsis secondary to UTI [facility]) Active Problems:   Charcot Marie Tooth muscular atrophy   Spinocerebellar ataxia (HCC)   Poor vision   Renal calculus   Hyperthyroidism   Hypokalemia   Sepsis secondary to UTI likely with from left ureteropelvic junction calculus Patient had fever, leukocytosis, tachycardia tachypnea on presentation with significant urinary abnormality suggestive of sepsis secondary to UTI.  Imaging without significant hydronephrosis.  Continue with IV hydration.  Urology has seen the patient today and plan for ureteral stent placement.  Will keep patient n.p.o. at this time  Hypoxia.  Clinically does not appear to be in distress.  On 2 L of oxygen saturating 95%.  Secondary to muscular atrophy with weakness and somnolence leading to decreased ventilation.  X-ray chest was negative.  CT of the chest showed some pleural effusion and bibasilar atelectasis/consolidation.  Patient is already on antibiotics.  Will continue for now.  Will wean off oxygen as able.  Encouraged deep breathing and incentive spirometry if possible.  Significant hypokalemia.  Potassium of 2.4 at presentation.  Received 40 mEq of oral potassium.  Will add IV 60 mEq of potassium.  Will need to continue to replenish aggressively.  Check levels in AM.  History of  recurrent UTI Family are not aware if she had stones in the past.  Urology has been consulted.  Follow recommendations.  Continue IV Rocephin .  Follow urine culture, blood cultures.  Cystic and solid mass in the left kidney.  Could not rule out renal cell  carcinoma.  Urology has been consulted.  Will need follow-up  Right renal mass.  Will need follow-up.  Hyperlipidemia.  On statin at home.  Will hold off for now.  Hypertension.  On losartan .  Will hold off for now.  Put on as needed hydralazine .  Hyperthyroidism.  On methimazole .  Will continue will check TSH.  Incidental thyroid  nodule noted on the CT scan of the chest.  Will need outpatient follow-up.  Has hypothyroidism.  History of bedbound status from muscular atrophy. From assisted living facility continue supportive care.  Will get PT OT evaluation once clinically stable.  On baclofen  from home will hold for now due to sluggishness. . DVT Prophylaxis: Lovenox  subcu from tomorrow  Review of Systems:  All systems were reviewed and were negative unless otherwise mentioned in the HPI   Past Medical History:  Diagnosis Date   Charcot Earnie Tooth muscular atrophy    Hyperlipidemia    Hypertension    Hyperthyroidism    Past Surgical History:  Procedure Laterality Date   HERNIA REPAIR     as a child, groin hernia    Social History:  reports that she has never smoked. She does not have any smokeless tobacco history on file. She reports that she does not drink alcohol and does not use drugs.  Allergies  Allergen Reactions   Tape Other (See Comments)    Kinesiology Tape  Rock Tape     Family History  Problem Relation Age of Onset   Hypertension Father    Heart disease Father    Hypertension Brother    Charcot-Marie-Tooth disease Brother      Prior to Admission medications   Medication Sig Start Date End Date Taking? Authorizing Provider  acetaminophen  (TYLENOL ) 500 MG tablet Take 1,000 mg by mouth every 8 (eight) hours.   Yes [provider]  atorvastatin  (LIPITOR) 10 MG tablet Take 10 mg by mouth daily. 10/08/23  Yes [provider]  baclofen  (LIORESAL ) 20 MG tablet Take 20 mg by mouth 3 (three) times daily. 09/23/23  Yes [provider]   bisacodyl  (DULCOLAX) 10 MG suppository Place 10 mg rectally every other day.   Yes [provider]  ergocalciferol  (VITAMIN D2) 1.25 MG (50000 UT) capsule Take 50,000 Units by mouth every Friday.   Yes [provider]  escitalopram  (LEXAPRO ) 10 MG tablet Take 10 mg by mouth daily. 10/21/23  Yes [provider]  lactulose  (CHRONULAC ) 10 GM/15ML solution Take 20 g by mouth daily. 08/27/23  Yes [provider]  losartan  (COZAAR ) 25 MG tablet Take 25 mg by mouth daily. 10/22/23  Yes [provider]  melatonin 3 MG TABS tablet Take 3 mg by mouth at bedtime.   Yes [provider]  meloxicam (MOBIC) 15 MG tablet Take 15 mg by mouth daily. 09/27/23  Yes [provider]  Menthol, Topical Analgesic, (BIOFREEZE EX) Apply 1 Application topically 4 (four) times daily. To back   Yes [provider]  methimazole  (TAPAZOLE ) 5 MG tablet Take 5 mg by mouth daily. 10/06/23  Yes [provider]  Multiple Vitamin (MULTIVITAMIN PO) Take 1 tablet by mouth daily.   Yes [provider]  ondansetron  (ZOFRAN ) 4 MG  tablet Take 4 mg by mouth every 6 (six) hours as needed for nausea or vomiting.   Yes [provider]  polyethylene glycol (MIRALAX  / GLYCOLAX ) 17 g packet Take 17 g by mouth daily.   Yes [provider]  sennosides-docusate sodium  (SENOKOT-S) 8.6-50 MG tablet Take 2 tablets by mouth 2 (two) times daily.   Yes [provider]  simethicone  (MYLICON) 125 MG chewable tablet Chew 125 mg by mouth 3 (three) times daily.   Yes [provider]  temazepam (RESTORIL) 7.5 MG capsule Take 7.5 mg by mouth at bedtime. 10/06/23  Yes [provider]    Physical Exam:  Vitals:   10/25/23 0700 10/25/23 0704 10/25/23 0926 10/25/23 1049  BP: (!) 140/78  122/79 106/70  Pulse: (!) 102 (!) 104 96 78  Resp: 20  19 18   Temp: 100.3 F (37.9 C) (!) 100.9 F (38.3 C) 98 F (36.7 C)   TempSrc: Axillary  Rectal Oral   SpO2: 91% 94% 93% 95%   Wt Readings from Last 3 Encounters:  No data found for Wt   There is no height or weight on file to calculate BMI.  General:  Average built, not in obvious distress, mildly somnolent but Communicative, has baseline dysarthria as per the sister at bedside, nasal cannula oxygen HENT: Normocephalic, No scleral pallor or icterus noted. Oral mucosa is moist.  Chest: Diminished breath sounds bilaterally CVS: S1 &S2 heard. No murmur.  Regular rate and rhythm. Abdomen: Soft, nontender, nondistended.  Bowel sounds are heard. No abdominal mass palpated.  No costovertebral angle tenderness noted. Extremities: No cyanosis, clubbing or edema.  Peripheral pulses are palpable. Psych: Alert, awake and oriented, normal mood CNS: , atrophy noted over the muscles, unable to move the lower extremities.  Mildly somnolent but Communicative, slow speech, Skin: Warm and dry.  No rashes noted.  Labs on Admission:   CBC: Recent Labs  Lab 10/25/23 0738  WBC 12.5*  NEUTROABS 10.9*  HGB 10.2*  HCT 33.1*  MCV 94.0  PLT 248    Basic Metabolic Panel: Recent Labs  Lab 10/25/23 0738  NA 141  K 2.8*  CL 112*  CO2 20*  GLUCOSE 101*  BUN 15  CREATININE 0.36*  CALCIUM  7.2*    Liver Function Tests: Recent Labs  Lab 10/25/23 0738  AST 11*  ALT 9  ALKPHOS 62  BILITOT 0.8  PROT 4.6*  ALBUMIN 2.5*   No results for input(s): LIPASE, AMYLASE in the last 168 hours. No results for input(s): AMMONIA in the last 168 hours.  Cardiac Enzymes: No results for input(s): CKTOTAL, CKMB, CKMBINDEX, TROPONINI in the last 168 hours.  BNP (last 3 results) No results for input(s): BNP in the last 8760 hours.  ProBNP (last 3 results) No results for input(s): PROBNP in the last 8760 hours.  CBG: No results for input(s): GLUCAP in the last 168 hours.  Lipase  No results found for: LIPASE   Urinalysis    Component Value Date/Time   COLORURINE  YELLOW 10/25/2023 0738   APPEARANCEUR CLOUDY (A) 10/25/2023 0738   LABSPEC 1.025 10/25/2023 0738   PHURINE 6.0 10/25/2023 0738   GLUCOSEU NEGATIVE 10/25/2023 0738   HGBUR LARGE (A) 10/25/2023 0738   BILIRUBINUR NEGATIVE 10/25/2023 0738   KETONESUR >80 (A) 10/25/2023 0738   PROTEINUR 100 (A) 10/25/2023 0738   NITRITE POSITIVE (A) 10/25/2023 0738   LEUKOCYTESUR SMALL (A) 10/25/2023 0738     Drugs of Abuse  No results found for: LABOPIA,  COCAINSCRNUR, LABBENZ, AMPHETMU, THCU, LABBARB    Radiological Exams on Admission: CT Angio Chest PE W/Cm &/Or Wo Cm Result Date: 10/25/2023 CLINICAL DATA:  PE suspected, versus pneumonia, lethargy, abdominal pain * Tracking Code: BO * EXAM: CT ANGIOGRAPHY CHEST CT ABDOMEN AND PELVIS WITH CONTRAST TECHNIQUE: Multidetector CT imaging of the chest was performed using the standard protocol during bolus administration of intravenous contrast. Multiplanar CT image reconstructions and MIPs were obtained to evaluate the vascular anatomy. Multidetector CT imaging of the abdomen and pelvis was performed using the standard protocol during bolus administration of intravenous contrast. RADIATION DOSE REDUCTION: This exam was performed according to the departmental dose-optimization program which includes automated exposure control, adjustment of the mA and/or kV according to patient size and/or use of iterative reconstruction technique. CONTRAST:  OMNIPAQUE  IOHEXOL  350 MG/ML SOLN COMPARISON:  None Available. FINDINGS: CT CHEST ANGIOGRAM FINDINGS Cardiovascular: Satisfactory opacification of the pulmonary arteries to the segmental level. No evidence of pulmonary embolism. Normal heart size. No pericardial effusion. Mediastinum/Nodes: No enlarged mediastinal, hilar, or axillary lymph nodes. Hypodense nodule of the left lobe of the thyroid  measuring 2.6 x 2.5 cm, which deflects the trachea rightward. 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 Esophagus  without significant findings. Lungs/Pleura: Trace pleural effusions and bibasilar atelectasis or consolidation. Musculoskeletal: No chest wall abnormality. No acute osseous findings. Review of the MIP images confirms the above findings. CT ABDOMEN PELVIS FINDINGS Hepatobiliary: No solid liver abnormality is seen. Multiple fluid attenuation cysts throughout the liver. No gallstones, gallbladder wall thickening, or biliary dilatation. Pancreas: Unremarkable. No pancreatic ductal dilatation or surrounding inflammatory changes. Spleen: Normal in size without significant abnormality. Adrenals/Urinary Tract: Adrenal glands are unremarkable. Hypodense masslike lesion in the anterior midportion of the right kidney measuring 3.1 x 2.5 cm (series 2, image 65). Right-sided perinephric fat stranding. Heterogeneously enhancing, mixed solid and cystic appearing mass arising from the peripheral superior pole of the left kidney measuring 1.7 x 1.6 cm (series 2, image 32). Calculus at the left ureteropelvic junction measuring 1.2 cm without significant hydronephrosis (series 7, image 85). Nonobstructive calculus in the dependent right renal pelvis. Bladder is unremarkable. Stomach/Bowel: Stomach is within normal limits. Appendix appears normal. No evidence of bowel wall thickening, distention, or inflammatory changes. Vascular/Lymphatic: No significant vascular findings are present. No enlarged abdominal or pelvic lymph nodes. Reproductive: No mass or other significant abnormality. Other: No abdominal wall hernia or abnormality. No ascites. Musculoskeletal: No acute or significant osseous findings. IMPRESSION: 1. Negative examination for pulmonary embolism. 2. Trace pleural effusions and bibasilar atelectasis or consolidation. 3. Hypodense masslike lesion in the anterior midportion of the right kidney measuring 3.1 x 2.5 cm. Right-sided perinephric fat stranding. This may reflect focal pyelonephritis or alternately a renal mass.  Contrast enhanced MRI may be helpful to further characterize. 4. Heterogeneously enhancing, mixed solid and cystic appearing mass arising from the peripheral superior pole of the left kidney measuring 1.7 x 1.6 cm. This is consistent with a renal cell carcinoma. 5. Calculus at the left ureteropelvic junction measuring 1.2 cm without significant hydronephrosis. 6. Nonobstructive calculus in the dependent right renal pelvis. 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 8. Hypodense nodule of the left lobe of the thyroid  measuring 2.6 x 2.5 cm, which deflects the trachea rightward. This can be further characterized by thyroid  ultrasound on a nonemergent, outpatient basis if and when clinically appropriate. Electronically Signed   By: Marolyn JONETTA Jaksch M.D.   On: 10/25/2023 11:23   CT ABDOMEN PELVIS W CONTRAST Result Date: 10/25/2023 CLINICAL DATA:  PE suspected, versus pneumonia, lethargy, abdominal pain * Tracking Code: BO * EXAM: CT ANGIOGRAPHY CHEST CT ABDOMEN AND PELVIS WITH CONTRAST TECHNIQUE: Multidetector CT imaging of the chest was performed using the standard protocol during bolus administration of intravenous contrast. Multiplanar CT image reconstructions and MIPs were obtained to evaluate the vascular anatomy. Multidetector CT imaging of the abdomen and pelvis was performed using the standard protocol during bolus administration of intravenous contrast. RADIATION DOSE REDUCTION: This exam was performed according to the departmental dose-optimization program which includes automated exposure control, adjustment of the mA and/or kV according to patient size and/or use of iterative reconstruction technique. CONTRAST:  OMNIPAQUE  IOHEXOL  350 MG/ML SOLN COMPARISON:  None Available. FINDINGS: CT CHEST ANGIOGRAM FINDINGS Cardiovascular: Satisfactory opacification of the pulmonary arteries to the segmental level. No evidence of pulmonary embolism. Normal heart size. No pericardial effusion. Mediastinum/Nodes: No enlarged  mediastinal, hilar, or axillary lymph nodes. Hypodense nodule of the left lobe of the thyroid  measuring 2.6 x 2.5 cm, which deflects the trachea rightward. 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 Esophagus without significant findings. Lungs/Pleura: Trace pleural effusions and bibasilar atelectasis or consolidation. Musculoskeletal: No chest wall abnormality. No acute osseous findings. Review of the MIP images confirms the above findings. CT ABDOMEN PELVIS FINDINGS Hepatobiliary: No solid liver abnormality is seen. Multiple fluid attenuation cysts throughout the liver. No gallstones, gallbladder wall thickening, or biliary dilatation. Pancreas: Unremarkable. No pancreatic ductal dilatation or surrounding inflammatory changes. Spleen: Normal in size without significant abnormality. Adrenals/Urinary Tract: Adrenal glands are unremarkable. Hypodense masslike lesion in the anterior midportion of the right kidney measuring 3.1 x 2.5 cm (series 2, image 65). Right-sided perinephric fat stranding. Heterogeneously enhancing, mixed solid and cystic appearing mass arising from the peripheral superior pole of the left kidney measuring 1.7 x 1.6 cm (series 2, image 32). Calculus at the left ureteropelvic junction measuring 1.2 cm without significant hydronephrosis (series 7, image 85). Nonobstructive calculus in the dependent right renal pelvis. Bladder is unremarkable. Stomach/Bowel: Stomach is within normal limits. Appendix appears normal. No evidence of bowel wall thickening, distention, or inflammatory changes. Vascular/Lymphatic: No significant vascular findings are present. No enlarged abdominal or pelvic lymph nodes. Reproductive: No mass or other significant abnormality. Other: No abdominal wall hernia or abnormality. No ascites. Musculoskeletal: No acute or significant osseous findings. IMPRESSION: 1. Negative examination for pulmonary embolism. 2. Trace pleural effusions and bibasilar atelectasis or  consolidation. 3. Hypodense masslike lesion in the anterior midportion of the right kidney measuring 3.1 x 2.5 cm. Right-sided perinephric fat stranding. This may reflect focal pyelonephritis or alternately a renal mass. Contrast enhanced MRI may be helpful to further characterize. 4. Heterogeneously enhancing, mixed solid and cystic appearing mass arising from the peripheral superior pole of the left kidney measuring 1.7 x 1.6 cm. This is consistent with a renal cell carcinoma. 5. Calculus at the left ureteropelvic junction measuring 1.2 cm without significant hydronephrosis. 6. Nonobstructive calculus in the dependent right renal pelvis. 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 8. Hypodense nodule of the left lobe of the thyroid  measuring 2.6 x 2.5 cm, which deflects the trachea rightward. This can be further characterized by thyroid  ultrasound on a nonemergent, outpatient basis if and when clinically appropriate. Electronically Signed   By: Marolyn JONETTA Jaksch M.D.   On: 10/25/2023 11:23   DG Chest 2 View Result Date: 10/25/2023 CLINICAL DATA:  Possible sepsis. EXAM: CHEST - 2 VIEW COMPARISON:  None Available. FINDINGS: The heart size and mediastinal contours are within normal limits. Both lungs are clear.  The visualized skeletal structures are unremarkable. IMPRESSION: No active cardiopulmonary disease. Electronically Signed   By: Lynwood Landy Raddle M.D.   On: 10/25/2023 07:55    EKG: Personally reviewed by me which shows sinus tachycardia   Consultant: Urology  Code Status: DNR  Microbiology blood cultures, urine culture  Antibiotics:-IV  Family Communication:  Patients' condition and plan of care including tests being ordered have been discussed with the patient and the sister at bedside who indicate understanding and agree with the plan.   Status is: Observation   Severity of Illness: The appropriate patient status for this patient is OBSERVATION. Observation status is judged to be reasonable and  necessary in order to provide the required intensity of service to ensure the patient's safety. The patient's presenting symptoms, physical exam findings, and initial radiographic and laboratory data in the context of their medical condition is felt to place them at decreased risk for further clinical deterioration. Furthermore, it is anticipated that the patient will be medically stable for discharge from the hospital within 2 midnights of admission.   Signed, Vernal Alstrom, MD [facility] 10/25/2023

## 2023-10-25 NOTE — Consult Note (Addendum)
 Urology Consult Note   Requesting Attending Physician:  Sonjia Held, MD Service Providing Consult: Urology  Consulting Attending: Dr. Alvaro   Reason for Consult:  ureteral stones  HPI: Sharon Bruce is seen in consultation for reasons noted above at the request of Pokhrel, Held, MD. patient is a 58 year old female who resides full-time in an assisted living facility.  She has been bedbound due to hereditary spinal muscular atrophy.  She had been particularly weak the last few days and was delivered to [facility] emergency department via EMS by her facility.  She was accompanied by her sister and caretaker who is an excellent historian.  Patient was alert, oriented, and in no distress.  Reviewed case and plan with images and all questions were answered to their satisfaction. ------------------  Assessment:   58 y.o. female with bilateral ureteral stones  Recommendations:  #Ureteral stones To the OR with Dr. Alvaro for cystoscopy with bilateral retrograde pyelograms and bilateral ureteral stent placement. Tentatively 5:30 PM  Definitive stone management on an outpatient basis.  # Chronic UTIs Urinalysis suggestive of urinary tract infection.  Nitrite positive.  Agree with broad ABX.  Tailor to sensitivities  Urology will follow  Case and plan discussed with Dr. Alvaro  Past Medical History: History reviewed. No pertinent past medical history.  Past Surgical History:  Past Surgical History:  Procedure Laterality Date   HERNIA REPAIR     as a child, groin hernia    Medication: Current Facility-Administered Medications  Medication Dose Route Frequency Provider Last Rate Last Admin   lactated ringers  infusion   Intravenous Continuous Fondaw, Wylder S, PA 150 mL/hr at 10/25/23 0752 New Bag at 10/25/23 9247   Current Outpatient Medications  Medication Sig Dispense Refill   acetaminophen  (TYLENOL ) 500 MG tablet Take 1,000 mg by mouth every 8 (eight) hours.      atorvastatin  (LIPITOR) 10 MG tablet Take 10 mg by mouth daily.     baclofen  (LIORESAL ) 20 MG tablet Take 20 mg by mouth 3 (three) times daily.     bisacodyl  (DULCOLAX) 10 MG suppository Place 10 mg rectally every other day.     ergocalciferol  (VITAMIN D2) 1.25 MG (50000 UT) capsule Take 50,000 Units by mouth every Friday.     escitalopram  (LEXAPRO ) 10 MG tablet Take 10 mg by mouth daily.     lactulose  (CHRONULAC ) 10 GM/15ML solution Take 20 g by mouth daily.     losartan  (COZAAR ) 25 MG tablet Take 25 mg by mouth daily.     melatonin 3 MG TABS tablet Take 3 mg by mouth at bedtime.     meloxicam (MOBIC) 15 MG tablet Take 15 mg by mouth daily.     Menthol, Topical Analgesic, (BIOFREEZE EX) Apply 1 Application topically 4 (four) times daily. To back     methimazole  (TAPAZOLE ) 5 MG tablet Take 5 mg by mouth daily.     Multiple Vitamin (MULTIVITAMIN PO) Take 1 tablet by mouth daily.     ondansetron  (ZOFRAN ) 4 MG tablet Take 4 mg by mouth every 6 (six) hours as needed for nausea or vomiting.     polyethylene glycol (MIRALAX  / GLYCOLAX ) 17 g packet Take 17 g by mouth daily.     sennosides-docusate sodium  (SENOKOT-S) 8.6-50 MG tablet Take 2 tablets by mouth 2 (two) times daily.     simethicone  (MYLICON) 125 MG chewable tablet Chew 125 mg by mouth 3 (three) times daily.     temazepam (RESTORIL) 7.5 MG capsule Take 7.5  mg by mouth at bedtime.      Allergies: Allergies  Allergen Reactions   Tape Other (See Comments)    Kinesiology Tape  Rock Tape     Social History: Social History   Tobacco Use   Smoking status: Never  Substance Use Topics   Alcohol use: No    Alcohol/week: 0.0 standard drinks of alcohol   Drug use: No    Family History Family History  Problem Relation Age of Onset   Hypertension Father    Heart disease Father    Hypertension Brother    Charcot-Marie-Tooth disease Brother     ROS   Objective   Vital signs in last 24 hours: BP 106/70 (BP Location: Left Arm)    Pulse 78   Temp 98 F (36.7 C) (Oral)   Resp 18   SpO2 95%   Physical Exam General: A&O, resting, appropriate HEENT: [place]/AT Pulmonary: Normal work of breathing Cardiovascular: no cyanosis Abdomen: Soft, NTTP, nondistender Neuro: immobile  Most Recent Labs: Lab Results  Component Value Date   WBC 12.5 (H) 10/25/2023   HGB 10.2 (L) 10/25/2023   HCT 33.1 (L) 10/25/2023   PLT 248 10/25/2023    Lab Results  Component Value Date   NA 141 10/25/2023   K 2.8 (L) 10/25/2023   CL 112 (H) 10/25/2023   CO2 20 (L) 10/25/2023   BUN 15 10/25/2023   CREATININE 0.36 (L) 10/25/2023   CALCIUM  7.2 (L) 10/25/2023    Lab Results  Component Value Date   INR 1.0 10/25/2023     Urine Culture: @[id number] (laburin,org,[id number],[id number])@   IMAGING: CT Angio Chest PE W/Cm &/Or Wo Cm Result Date: 10/25/2023 CLINICAL DATA:  PE suspected, versus pneumonia, lethargy, abdominal pain * Tracking Code: BO * EXAM: CT ANGIOGRAPHY CHEST CT ABDOMEN AND PELVIS WITH CONTRAST TECHNIQUE: Multidetector CT imaging of the chest was performed using the standard protocol during bolus administration of intravenous contrast. Multiplanar CT image reconstructions and MIPs were obtained to evaluate the vascular anatomy. Multidetector CT imaging of the abdomen and pelvis was performed using the standard protocol during bolus administration of intravenous contrast. RADIATION DOSE REDUCTION: This exam was performed according to the departmental dose-optimization program which includes automated exposure control, adjustment of the mA and/or kV according to patient size and/or use of iterative reconstruction technique. CONTRAST:  OMNIPAQUE  IOHEXOL  350 MG/ML SOLN COMPARISON:  None Available. FINDINGS: CT CHEST ANGIOGRAM FINDINGS Cardiovascular: Satisfactory opacification of the pulmonary arteries to the segmental level. No evidence of pulmonary embolism. Normal heart size. No pericardial effusion. Mediastinum/Nodes: No enlarged  mediastinal, hilar, or axillary lymph nodes. Hypodense nodule of the left lobe of the thyroid  measuring 2.6 x 2.5 cm, which deflects the trachea rightward. 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 Esophagus without significant findings. Lungs/Pleura: Trace pleural effusions and bibasilar atelectasis or consolidation. Musculoskeletal: No chest wall abnormality. No acute osseous findings. Review of the MIP images confirms the above findings. CT ABDOMEN PELVIS FINDINGS Hepatobiliary: No solid liver abnormality is seen. Multiple fluid attenuation cysts throughout the liver. No gallstones, gallbladder wall thickening, or biliary dilatation. Pancreas: Unremarkable. No pancreatic ductal dilatation or surrounding inflammatory changes. Spleen: Normal in size without significant abnormality. Adrenals/Urinary Tract: Adrenal glands are unremarkable. Hypodense masslike lesion in the anterior midportion of the right kidney measuring 3.1 x 2.5 cm (series 2, image 65). Right-sided perinephric fat stranding. Heterogeneously enhancing, mixed solid and cystic appearing mass arising from the peripheral superior pole of the left kidney measuring 1.7 x 1.6 cm (series  2, image 32). Calculus at the left ureteropelvic junction measuring 1.2 cm without significant hydronephrosis (series 7, image 85). Nonobstructive calculus in the dependent right renal pelvis. Bladder is unremarkable. Stomach/Bowel: Stomach is within normal limits. Appendix appears normal. No evidence of bowel wall thickening, distention, or inflammatory changes. Vascular/Lymphatic: No significant vascular findings are present. No enlarged abdominal or pelvic lymph nodes. Reproductive: No mass or other significant abnormality. Other: No abdominal wall hernia or abnormality. No ascites. Musculoskeletal: No acute or significant osseous findings. IMPRESSION: 1. Negative examination for pulmonary embolism. 2. Trace pleural effusions and bibasilar atelectasis or  consolidation. 3. Hypodense masslike lesion in the anterior midportion of the right kidney measuring 3.1 x 2.5 cm. Right-sided perinephric fat stranding. This may reflect focal pyelonephritis or alternately a renal mass. Contrast enhanced MRI may be helpful to further characterize. 4. Heterogeneously enhancing, mixed solid and cystic appearing mass arising from the peripheral superior pole of the left kidney measuring 1.7 x 1.6 cm. This is consistent with a renal cell carcinoma. 5. Calculus at the left ureteropelvic junction measuring 1.2 cm without significant hydronephrosis. 6. Nonobstructive calculus in the dependent right renal pelvis. 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 8. Hypodense nodule of the left lobe of the thyroid  measuring 2.6 x 2.5 cm, which deflects the trachea rightward. This can be further characterized by thyroid  ultrasound on a nonemergent, outpatient basis if and when clinically appropriate. Electronically Signed   By: Marolyn JONETTA Jaksch M.D.   On: 10/25/2023 11:23   CT ABDOMEN PELVIS W CONTRAST Result Date: 10/25/2023 CLINICAL DATA:  PE suspected, versus pneumonia, lethargy, abdominal pain * Tracking Code: BO * EXAM: CT ANGIOGRAPHY CHEST CT ABDOMEN AND PELVIS WITH CONTRAST TECHNIQUE: Multidetector CT imaging of the chest was performed using the standard protocol during bolus administration of intravenous contrast. Multiplanar CT image reconstructions and MIPs were obtained to evaluate the vascular anatomy. Multidetector CT imaging of the abdomen and pelvis was performed using the standard protocol during bolus administration of intravenous contrast. RADIATION DOSE REDUCTION: This exam was performed according to the departmental dose-optimization program which includes automated exposure control, adjustment of the mA and/or kV according to patient size and/or use of iterative reconstruction technique. CONTRAST:  OMNIPAQUE  IOHEXOL  350 MG/ML SOLN COMPARISON:  None Available. FINDINGS: CT CHEST  ANGIOGRAM FINDINGS Cardiovascular: Satisfactory opacification of the pulmonary arteries to the segmental level. No evidence of pulmonary embolism. Normal heart size. No pericardial effusion. Mediastinum/Nodes: No enlarged mediastinal, hilar, or axillary lymph nodes. Hypodense nodule of the left lobe of the thyroid  measuring 2.6 x 2.5 cm, which deflects the trachea rightward. 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 Esophagus without significant findings. Lungs/Pleura: Trace pleural effusions and bibasilar atelectasis or consolidation. Musculoskeletal: No chest wall abnormality. No acute osseous findings. Review of the MIP images confirms the above findings. CT ABDOMEN PELVIS FINDINGS Hepatobiliary: No solid liver abnormality is seen. Multiple fluid attenuation cysts throughout the liver. No gallstones, gallbladder wall thickening, or biliary dilatation. Pancreas: Unremarkable. No pancreatic ductal dilatation or surrounding inflammatory changes. Spleen: Normal in size without significant abnormality. Adrenals/Urinary Tract: Adrenal glands are unremarkable. Hypodense masslike lesion in the anterior midportion of the right kidney measuring 3.1 x 2.5 cm (series 2, image 65). Right-sided perinephric fat stranding. Heterogeneously enhancing, mixed solid and cystic appearing mass arising from the peripheral superior pole of the left kidney measuring 1.7 x 1.6 cm (series 2, image 32). Calculus at the left ureteropelvic junction measuring 1.2 cm without significant hydronephrosis (series 7, image 85). Nonobstructive calculus in the dependent right  renal pelvis. Bladder is unremarkable. Stomach/Bowel: Stomach is within normal limits. Appendix appears normal. No evidence of bowel wall thickening, distention, or inflammatory changes. Vascular/Lymphatic: No significant vascular findings are present. No enlarged abdominal or pelvic lymph nodes. Reproductive: No mass or other significant abnormality. Other: No  abdominal wall hernia or abnormality. No ascites. Musculoskeletal: No acute or significant osseous findings. IMPRESSION: 1. Negative examination for pulmonary embolism. 2. Trace pleural effusions and bibasilar atelectasis or consolidation. 3. Hypodense masslike lesion in the anterior midportion of the right kidney measuring 3.1 x 2.5 cm. Right-sided perinephric fat stranding. This may reflect focal pyelonephritis or alternately a renal mass. Contrast enhanced MRI may be helpful to further characterize. 4. Heterogeneously enhancing, mixed solid and cystic appearing mass arising from the peripheral superior pole of the left kidney measuring 1.7 x 1.6 cm. This is consistent with a renal cell carcinoma. 5. Calculus at the left ureteropelvic junction measuring 1.2 cm without significant hydronephrosis. 6. Nonobstructive calculus in the dependent right renal pelvis. 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 8. Hypodense nodule of the left lobe of the thyroid  measuring 2.6 x 2.5 cm, which deflects the trachea rightward. This can be further characterized by thyroid  ultrasound on a nonemergent, outpatient basis if and when clinically appropriate. Electronically Signed   By: Marolyn JONETTA Jaksch M.D.   On: 10/25/2023 11:23   DG Chest 2 View Result Date: 10/25/2023 CLINICAL DATA:  Possible sepsis. EXAM: CHEST - 2 VIEW COMPARISON:  None Available. FINDINGS: The heart size and mediastinal contours are within normal limits. Both lungs are clear. The visualized skeletal structures are unremarkable. IMPRESSION: No active cardiopulmonary disease. Electronically Signed   By: Lynwood Landy Raddle M.D.   On: 10/25/2023 07:55    ------  Ole Bourdon, NP Pager: [phone]   Please contact the urology consult pager with any further questions/concerns.    I have seen and examined the patient and agree with Cameron's plan. Urgent OR for bilateral stents followed by medical admission for IV ABX and fever free for 24 hrs before transfer  back to [facility]. Sister Darice discussed plan and agrees who if exceptionally helpful and knowledgable about Jericca.

## 2023-10-25 NOTE — Transfer of Care (Signed)
 Immediate Anesthesia Transfer of Care Note  Patient: Sharon Bruce  Procedure(s) Performed: CYSTOSCOPY, WITH RETROGRADE PYELOGRAM AND URETERAL STENT INSERTION (Bilateral: Ureter)  Patient Location: PACU  Anesthesia Type:General  Level of Consciousness: drowsy  Airway & Oxygen Therapy: Patient Spontanous Breathing and Patient connected to face mask oxygen  Post-op Assessment: Report given to RN and Post -op Vital signs reviewed and stable  Post vital signs: Reviewed and stable  Last Vitals:  Vitals Value Taken Time  BP 131/75 10/25/23 18:53  Temp    Pulse 88 10/25/23 18:57  Resp 18 10/25/23 18:57  SpO2 99 % 10/25/23 18:57  Vitals shown include unfiled device data.  Last Pain:  Vitals:   10/25/23 1624  TempSrc:   PainSc: 0-No pain         Complications: No notable events documented.

## 2023-10-25 NOTE — Progress Notes (Signed)
 * Day of Surgery *   Subjective/Chief Complaint:  1 - BILATERAL Renal / UPJ Stones - Rt 6mm renal and Lt 17mm UPJ sotne with mild left hydro on ER CT 10/25/23.  2 - Urosepsis - fevers, tachycardia, bacteruria c/w early urosepsis at ER presentation 09/2023. Placed on empiric rocephin . MAYBELL PARKER 8/26 pending.  3 - BILATERAL Renal Masses - ?Rt 3cm anterior mid heterogenous mass v. Area focal pyelo on ER CT. 2 artery / 1 vein right renovascular anatomy. Lt 1.7 lateral superior heterogenous mass as well, also 3 artery / 1 vein left renovascular anatomy.   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 Sister Darice involved at [phone]  Today Derica is seen for urgent bilateral stenting for L>R partially obsturcting stones in setting of early sepsis.    Objective: Vital signs in last 24 hours: Temp:  [98 F (36.7 C)-100.9 F (38.3 C)] 98.5 F (36.9 C) (08/26 1400) Pulse Rate:  [78-106] 106 (08/26 1400) Resp:  [17-20] 17 (08/26 1400) BP: (106-140)/(70-83) 121/83 (08/26 1400) SpO2:  [91 %-98 %] 98 % (08/26 1400) Last BM Date :  (UTA)  Intake/Output from previous day: No intake/output data recorded. Intake/Output this shift: Total I/O In: 914.6 [I.V.:858.1; IV Piggyback:56.5] Out: -   NAD, AOx3 Non-labored breathing on [place] O2 Large truncal obesity limits sensitivity of exam No catheter No c/c/e  Lab Results:  Recent Labs    10/25/23 0738  WBC 12.5*  HGB 10.2*  HCT 33.1*  PLT 248   BMET Recent Labs    10/25/23 0738  NA 141  K 2.8*  CL 112*  CO2 20*  GLUCOSE 101*  BUN 15  CREATININE 0.36*  CALCIUM  7.2*   PT/INR Recent Labs    10/25/23 0738  LABPROT 14.1  INR 1.0   ABG No results for input(s): PHART, HCO3 in the last 72 hours.  Invalid input(s): PCO2, PO2  Studies/Results: CT Angio Chest PE W/Cm &/Or Wo Cm Result Date: 10/25/2023 CLINICAL DATA:  PE suspected, versus pneumonia, lethargy,  abdominal pain * Tracking Code: BO * EXAM: CT ANGIOGRAPHY CHEST CT ABDOMEN AND PELVIS WITH CONTRAST TECHNIQUE: Multidetector CT imaging of the chest was performed using the standard protocol during bolus administration of intravenous contrast. Multiplanar CT image reconstructions and MIPs were obtained to evaluate the vascular anatomy. Multidetector CT imaging of the abdomen and pelvis was performed using the standard protocol during bolus administration of intravenous contrast. RADIATION DOSE REDUCTION: This exam was performed according to the departmental dose-optimization program which includes automated exposure control, adjustment of the mA and/or kV according to patient size and/or use of iterative reconstruction technique. CONTRAST:  OMNIPAQUE  IOHEXOL  350 MG/ML SOLN COMPARISON:  None Available. FINDINGS: CT CHEST ANGIOGRAM FINDINGS Cardiovascular: Satisfactory opacification of the pulmonary arteries to the segmental level. No evidence of pulmonary embolism. Normal heart size. No pericardial effusion. Mediastinum/Nodes: No enlarged mediastinal, hilar, or axillary lymph nodes. Hypodense nodule of the left lobe of the thyroid  measuring 2.6 x 2.5 cm, which deflects the trachea rightward. 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 Esophagus without significant findings. Lungs/Pleura: Trace pleural effusions and bibasilar atelectasis or consolidation. Musculoskeletal: No chest wall abnormality. No acute osseous findings. Review of the MIP images confirms the above findings. CT ABDOMEN PELVIS FINDINGS Hepatobiliary: No solid liver abnormality is seen. Multiple fluid attenuation cysts throughout the liver. No gallstones, gallbladder wall thickening, or biliary dilatation. Pancreas: Unremarkable. No pancreatic ductal dilatation or surrounding inflammatory  changes. Spleen: Normal in size without significant abnormality. Adrenals/Urinary Tract: Adrenal glands are unremarkable. Hypodense masslike lesion  in the anterior midportion of the right kidney measuring 3.1 x 2.5 cm (series 2, image 65). Right-sided perinephric fat stranding. Heterogeneously enhancing, mixed solid and cystic appearing mass arising from the peripheral superior pole of the left kidney measuring 1.7 x 1.6 cm (series 2, image 32). Calculus at the left ureteropelvic junction measuring 1.2 cm without significant hydronephrosis (series 7, image 85). Nonobstructive calculus in the dependent right renal pelvis. Bladder is unremarkable. Stomach/Bowel: Stomach is within normal limits. Appendix appears normal. No evidence of bowel wall thickening, distention, or inflammatory changes. Vascular/Lymphatic: No significant vascular findings are present. No enlarged abdominal or pelvic lymph nodes. Reproductive: No mass or other significant abnormality. Other: No abdominal wall hernia or abnormality. No ascites. Musculoskeletal: No acute or significant osseous findings. IMPRESSION: 1. Negative examination for pulmonary embolism. 2. Trace pleural effusions and bibasilar atelectasis or consolidation. 3. Hypodense masslike lesion in the anterior midportion of the right kidney measuring 3.1 x 2.5 cm. Right-sided perinephric fat stranding. This may reflect focal pyelonephritis or alternately a renal mass. Contrast enhanced MRI may be helpful to further characterize. 4. Heterogeneously enhancing, mixed solid and cystic appearing mass arising from the peripheral superior pole of the left kidney measuring 1.7 x 1.6 cm. This is consistent with a renal cell carcinoma. 5. Calculus at the left ureteropelvic junction measuring 1.2 cm without significant hydronephrosis. 6. Nonobstructive calculus in the dependent right renal pelvis. 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 8. Hypodense nodule of the left lobe of the thyroid  measuring 2.6 x 2.5 cm, which deflects the trachea rightward. This can be further characterized by thyroid  ultrasound on a nonemergent, outpatient basis if  and when clinically appropriate. Electronically Signed   By: Marolyn JONETTA Jaksch M.D.   On: 10/25/2023 11:23   CT ABDOMEN PELVIS W CONTRAST Result Date: 10/25/2023 CLINICAL DATA:  PE suspected, versus pneumonia, lethargy, abdominal pain * Tracking Code: BO * EXAM: CT ANGIOGRAPHY CHEST CT ABDOMEN AND PELVIS WITH CONTRAST TECHNIQUE: Multidetector CT imaging of the chest was performed using the standard protocol during bolus administration of intravenous contrast. Multiplanar CT image reconstructions and MIPs were obtained to evaluate the vascular anatomy. Multidetector CT imaging of the abdomen and pelvis was performed using the standard protocol during bolus administration of intravenous contrast. RADIATION DOSE REDUCTION: This exam was performed according to the departmental dose-optimization program which includes automated exposure control, adjustment of the mA and/or kV according to patient size and/or use of iterative reconstruction technique. CONTRAST:  OMNIPAQUE  IOHEXOL  350 MG/ML SOLN COMPARISON:  None Available. FINDINGS: CT CHEST ANGIOGRAM FINDINGS Cardiovascular: Satisfactory opacification of the pulmonary arteries to the segmental level. No evidence of pulmonary embolism. Normal heart size. No pericardial effusion. Mediastinum/Nodes: No enlarged mediastinal, hilar, or axillary lymph nodes. Hypodense nodule of the left lobe of the thyroid  measuring 2.6 x 2.5 cm, which deflects the trachea rightward. 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 Esophagus without significant findings. Lungs/Pleura: Trace pleural effusions and bibasilar atelectasis or consolidation. Musculoskeletal: No chest wall abnormality. No acute osseous findings. Review of the MIP images confirms the above findings. CT ABDOMEN PELVIS FINDINGS Hepatobiliary: No solid liver abnormality is seen. Multiple fluid attenuation cysts throughout the liver. No gallstones, gallbladder wall thickening, or biliary dilatation. Pancreas:  Unremarkable. No pancreatic ductal dilatation or surrounding inflammatory changes. Spleen: Normal in size without significant abnormality. Adrenals/Urinary Tract: Adrenal glands are unremarkable. Hypodense masslike lesion in the anterior midportion of the right  kidney measuring 3.1 x 2.5 cm (series 2, image 65). Right-sided perinephric fat stranding. Heterogeneously enhancing, mixed solid and cystic appearing mass arising from the peripheral superior pole of the left kidney measuring 1.7 x 1.6 cm (series 2, image 32). Calculus at the left ureteropelvic junction measuring 1.2 cm without significant hydronephrosis (series 7, image 85). Nonobstructive calculus in the dependent right renal pelvis. Bladder is unremarkable. Stomach/Bowel: Stomach is within normal limits. Appendix appears normal. No evidence of bowel wall thickening, distention, or inflammatory changes. Vascular/Lymphatic: No significant vascular findings are present. No enlarged abdominal or pelvic lymph nodes. Reproductive: No mass or other significant abnormality. Other: No abdominal wall hernia or abnormality. No ascites. Musculoskeletal: No acute or significant osseous findings. IMPRESSION: 1. Negative examination for pulmonary embolism. 2. Trace pleural effusions and bibasilar atelectasis or consolidation. 3. Hypodense masslike lesion in the anterior midportion of the right kidney measuring 3.1 x 2.5 cm. Right-sided perinephric fat stranding. This may reflect focal pyelonephritis or alternately a renal mass. Contrast enhanced MRI may be helpful to further characterize. 4. Heterogeneously enhancing, mixed solid and cystic appearing mass arising from the peripheral superior pole of the left kidney measuring 1.7 x 1.6 cm. This is consistent with a renal cell carcinoma. 5. Calculus at the left ureteropelvic junction measuring 1.2 cm without significant hydronephrosis. 6. Nonobstructive calculus in the dependent right renal pelvis. 7. 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 8. Hypodense nodule of the left lobe of the thyroid  measuring 2.6 x 2.5 cm, which deflects the trachea rightward. This can be further characterized by thyroid  ultrasound on a nonemergent, outpatient basis if and when clinically appropriate. Electronically Signed   By: Marolyn JONETTA Jaksch M.D.   On: 10/25/2023 11:23   DG Chest 2 View Result Date: 10/25/2023 CLINICAL DATA:  Possible sepsis. EXAM: CHEST - 2 VIEW COMPARISON:  None Available. FINDINGS: The heart size and mediastinal contours are within normal limits. Both lungs are clear. The visualized skeletal structures are unremarkable. IMPRESSION: No active cardiopulmonary disease. Electronically Signed   By: Lynwood Landy Raddle M.D.   On: 10/25/2023 07:55    Anti-infectives: Anti-infectives (From admission, onward)    Start     Dose/Rate Route Frequency Ordered Stop   10/26/23 0800  cefTRIAXone  (ROCEPHIN ) 2 g in sodium chloride  0.9 % 100 mL IVPB        2 g 200 mL/hr over 30 Minutes Intravenous Every 24 hours 10/25/23 1342     10/25/23 0715  cefTRIAXone  (ROCEPHIN ) 2 g in sodium chloride  0.9 % 100 mL IVPB        2 g 200 mL/hr over 30 Minutes Intravenous Once 10/25/23 0705 10/25/23 0819   10/25/23 0715  azithromycin  (ZITHROMAX ) 500 mg in sodium chloride  0.9 % 250 mL IVPB        500 mg 250 mL/hr over 60 Minutes Intravenous  Once 10/25/23 0705 10/25/23 0853       Assessment/Plan:  Proceed as planned with cysto, BILATERAL retrogrades and stenting for urgent renal deomcpression. Risks, benefits, alternatives, expected peri-op course and need for later 2nd stage proceure to address stones once infection cleard discussed.    Ricardo KATHEE Alvaro Raddle. 10/25/2023

## 2023-10-25 NOTE — ED Provider Notes (Signed)
 [facility] Provider Note   CSN: [id number] Arrival date & time: 10/25/23  9349     Patient presents with: Altered Mental Status (PT bib [facility]. Per EMS PT normally a&o x3 at baseline PT now lethargic and only nodding yes or no to questions.)   Sharon Bruce is a 58 y.o. female.    Altered Mental Status  Patient is a 58 year old female with a past medical history significant for spinal muscular atrophy brought in from facility for patient being more fatigued/lethargic than usual.  At baseline she is bedbound but alert and oriented x 3 and quick to answer questions.  Seems that this morning she was answering questions by shaking her head yes or no.  Found to have a fever and transported by EMS she received 1 L of normal saline in transport.  She denies any pain on my examination she does indicate that she has been urinating more frequently and has had some dysuria.  Specifically she denies any flank pain or nausea or vomiting.  Has not had any diarrhea.  Does not have a Foley catheter.  Normally wears adult diaper.  She does not have any history of wounds or cellulitis.     Prior to Admission medications   Medication Sig Start Date End Date Taking? Authorizing Provider  acetaminophen  (TYLENOL ) 500 MG tablet Take 1,000 mg by mouth every 8 (eight) hours.   Yes [provider]  atorvastatin  (LIPITOR) 10 MG tablet Take 10 mg by mouth daily. 10/08/23  Yes [provider]  baclofen  (LIORESAL ) 20 MG tablet Take 20 mg by mouth 3 (three) times daily. 09/23/23  Yes [provider]  bisacodyl  (DULCOLAX) 10 MG suppository Place 10 mg rectally every other day.   Yes [provider]  ergocalciferol  (VITAMIN D2) 1.25 MG (50000 UT) capsule Take 50,000 Units by mouth every Friday.   Yes [provider]  escitalopram  (LEXAPRO ) 10 MG tablet Take 10 mg by mouth daily. 10/21/23  Yes [provider]  lactulose   (CHRONULAC ) 10 GM/15ML solution Take 20 g by mouth daily. 08/27/23  Yes [provider]  losartan  (COZAAR ) 25 MG tablet Take 25 mg by mouth daily. 10/22/23  Yes [provider]  melatonin 3 MG TABS tablet Take 3 mg by mouth at bedtime.   Yes [provider]  meloxicam (MOBIC) 15 MG tablet Take 15 mg by mouth daily. 09/27/23  Yes [provider]  Menthol, Topical Analgesic, (BIOFREEZE EX) Apply 1 Application topically 4 (four) times daily. To back   Yes [provider]  methimazole  (TAPAZOLE ) 5 MG tablet Take 5 mg by mouth daily. 10/06/23  Yes [provider]  Multiple Vitamin (MULTIVITAMIN PO) Take 1 tablet by mouth daily.   Yes [provider]  ondansetron  (ZOFRAN ) 4 MG tablet Take 4 mg by mouth every 6 (six) hours as needed for nausea or vomiting.   Yes [provider]  polyethylene glycol (MIRALAX  / GLYCOLAX ) 17 g packet Take 17 g by mouth daily.   Yes [provider]  sennosides-docusate sodium  (SENOKOT-S) 8.6-50 MG tablet Take 2 tablets by mouth 2 (two) times daily.   Yes [provider]  simethicone  (MYLICON) 125 MG chewable tablet Chew 125 mg by mouth 3 (three) times daily.   Yes [provider]  temazepam (RESTORIL) 7.5 MG capsule Take 7.5 mg by mouth at bedtime. 10/06/23  Yes [provider]    Allergies: Tape    Review  of Systems  Updated Vital Signs BP 121/83 (BP Location: Left Arm)   Pulse (!) 106   Temp 98.5 F (36.9 C) (Oral)   Resp 17   SpO2 98%   Physical Exam Vitals and nursing note reviewed.  Constitutional:      General: She is not in acute distress.    Appearance: She is obese. She is ill-appearing.  HENT:     Head: Normocephalic and atraumatic.     Nose: Nose normal.     Mouth/Throat:     Mouth: Mucous membranes are dry.  Eyes:     General: No scleral icterus. Cardiovascular:     Rate and Rhythm: Regular rhythm. Tachycardia present.     Pulses: Normal  pulses.     Heart sounds: Normal heart sounds.  Pulmonary:     Effort: Pulmonary effort is normal. No respiratory distress.     Breath sounds: No wheezing.  Abdominal:     Palpations: Abdomen is soft.     Tenderness: There is no abdominal tenderness. There is no guarding or rebound.  Musculoskeletal:     Cervical back: Normal range of motion.     Right lower leg: No edema.     Left lower leg: No edema.     Comments: Feet are permanently plantarflexed bilaterally no unilateral leg swelling  Skin:    General: Skin is warm and dry.     Capillary Refill: Capillary refill takes less than 2 seconds.     Comments: Posterior buttock/glutes without cellulitis or abscess or wounds.  Neurological:     Mental Status: She is alert. Mental status is at baseline.  Psychiatric:        Mood and Affect: Mood normal.        Behavior: Behavior normal.     (all labs ordered are listed, but only abnormal results are displayed) Labs Reviewed  COMPREHENSIVE METABOLIC PANEL WITH GFR - Abnormal; Notable for the following components:      Result Value   Potassium 2.8 (*)    Chloride 112 (*)    CO2 20 (*)    Glucose, Bld 101 (*)    Creatinine, Ser 0.36 (*)    Calcium  7.2 (*)    Total Protein 4.6 (*)    Albumin 2.5 (*)    AST 11 (*)    All other components within normal limits  CBC WITH DIFFERENTIAL/PLATELET - Abnormal; Notable for the following components:   WBC 12.5 (*)    RBC 3.52 (*)    Hemoglobin 10.2 (*)    HCT 33.1 (*)    Neutro Abs 10.9 (*)    Lymphs Abs 0.5 (*)    All other components within normal limits  URINALYSIS, W/ REFLEX TO CULTURE (INFECTION SUSPECTED) - Abnormal; Notable for the following components:   APPearance CLOUDY (*)    Hgb urine dipstick LARGE (*)    Ketones, ur >80 (*)    Protein, ur 100 (*)    Nitrite POSITIVE (*)    Leukocytes,Ua SMALL (*)    Bacteria, UA MANY (*)    Non Squamous Epithelial 0-5 (*)    All other components within normal limits  RESP PANEL BY  RT-PCR (RSV, FLU A&B, COVID)  RVPGX2  CULTURE, BLOOD (ROUTINE X 2)  CULTURE, BLOOD (ROUTINE X 2)  URINE CULTURE  PROTIME-INR  HIV ANTIBODY (ROUTINE TESTING W REFLEX)  TSH  I-STAT CG4 LACTIC ACID, ED  I-STAT CG4 LACTIC ACID, ED    EKG: EKG Interpretation Date/Time:  Tuesday October 25 2023 07:41:23 EDT Ventricular Rate:  106 PR Interval:  162 QRS Duration:  95 QT Interval:  354 QTC Calculation: 471 R Axis:   105  Text Interpretation: Sinus tachycardia Biatrial enlargement Right ventricular hypertrophy Borderline T abnormalities, anterior leads Confirmed by Neysa Clap [phone]) on 10/25/2023 8:24:43 AM  Radiology: CT Angio Chest PE W/Cm &/Or Wo Cm Result Date: 10/25/2023 CLINICAL DATA:  PE suspected, versus pneumonia, lethargy, abdominal pain * Tracking Code: BO * EXAM: CT ANGIOGRAPHY CHEST CT ABDOMEN AND PELVIS WITH CONTRAST TECHNIQUE: Multidetector CT imaging of the chest was performed using the standard protocol during bolus administration of intravenous contrast. Multiplanar CT image reconstructions and MIPs were obtained to evaluate the vascular anatomy. Multidetector CT imaging of the abdomen and pelvis was performed using the standard protocol during bolus administration of intravenous contrast. RADIATION DOSE REDUCTION: This exam was performed according to the departmental dose-optimization program which includes automated exposure control, adjustment of the mA and/or kV according to patient size and/or use of iterative reconstruction technique. CONTRAST:  OMNIPAQUE  IOHEXOL  350 MG/ML SOLN COMPARISON:  None Available. FINDINGS: CT CHEST ANGIOGRAM FINDINGS Cardiovascular: Satisfactory opacification of the pulmonary arteries to the segmental level. No evidence of pulmonary embolism. Normal heart size. No pericardial effusion. Mediastinum/Nodes: No enlarged mediastinal, hilar, or axillary lymph nodes. Hypodense nodule of the left lobe of the thyroid  measuring 2.6 x 2.5 cm, which  deflects the trachea rightward. 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 Esophagus without significant findings. Lungs/Pleura: Trace pleural effusions and bibasilar atelectasis or consolidation. Musculoskeletal: No chest wall abnormality. No acute osseous findings. Review of the MIP images confirms the above findings. CT ABDOMEN PELVIS FINDINGS Hepatobiliary: No solid liver abnormality is seen. Multiple fluid attenuation cysts throughout the liver. No gallstones, gallbladder wall thickening, or biliary dilatation. Pancreas: Unremarkable. No pancreatic ductal dilatation or surrounding inflammatory changes. Spleen: Normal in size without significant abnormality. Adrenals/Urinary Tract: Adrenal glands are unremarkable. Hypodense masslike lesion in the anterior midportion of the right kidney measuring 3.1 x 2.5 cm (series 2, image 65). Right-sided perinephric fat stranding. Heterogeneously enhancing, mixed solid and cystic appearing mass arising from the peripheral superior pole of the left kidney measuring 1.7 x 1.6 cm (series 2, image 32). Calculus at the left ureteropelvic junction measuring 1.2 cm without significant hydronephrosis (series 7, image 85). Nonobstructive calculus in the dependent right renal pelvis. Bladder is unremarkable. Stomach/Bowel: Stomach is within normal limits. Appendix appears normal. No evidence of bowel wall thickening, distention, or inflammatory changes. Vascular/Lymphatic: No significant vascular findings are present. No enlarged abdominal or pelvic lymph nodes. Reproductive: No mass or other significant abnormality. Other: No abdominal wall hernia or abnormality. No ascites. Musculoskeletal: No acute or significant osseous findings. IMPRESSION: 1. Negative examination for pulmonary embolism. 2. Trace pleural effusions and bibasilar atelectasis or consolidation. 3. Hypodense masslike lesion in the anterior midportion of the right kidney measuring 3.1 x 2.5 cm. Right-sided  perinephric fat stranding. This may reflect focal pyelonephritis or alternately a renal mass. Contrast enhanced MRI may be helpful to further characterize. 4. Heterogeneously enhancing, mixed solid and cystic appearing mass arising from the peripheral superior pole of the left kidney measuring 1.7 x 1.6 cm. This is consistent with a renal cell carcinoma. 5. Calculus at the left ureteropelvic junction measuring 1.2 cm without significant hydronephrosis. 6. Nonobstructive calculus in the dependent right renal pelvis. 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 8. Hypodense nodule of the left lobe of the thyroid  measuring 2.6 x 2.5 cm, which deflects the trachea rightward.  This can be further characterized by thyroid  ultrasound on a nonemergent, outpatient basis if and when clinically appropriate. Electronically Signed   By: Marolyn JONETTA Jaksch M.D.   On: 10/25/2023 11:23   CT ABDOMEN PELVIS W CONTRAST Result Date: 10/25/2023 CLINICAL DATA:  PE suspected, versus pneumonia, lethargy, abdominal pain * Tracking Code: BO * EXAM: CT ANGIOGRAPHY CHEST CT ABDOMEN AND PELVIS WITH CONTRAST TECHNIQUE: Multidetector CT imaging of the chest was performed using the standard protocol during bolus administration of intravenous contrast. Multiplanar CT image reconstructions and MIPs were obtained to evaluate the vascular anatomy. Multidetector CT imaging of the abdomen and pelvis was performed using the standard protocol during bolus administration of intravenous contrast. RADIATION DOSE REDUCTION: This exam was performed according to the departmental dose-optimization program which includes automated exposure control, adjustment of the mA and/or kV according to patient size and/or use of iterative reconstruction technique. CONTRAST:  OMNIPAQUE  IOHEXOL  350 MG/ML SOLN COMPARISON:  None Available. FINDINGS: CT CHEST ANGIOGRAM FINDINGS Cardiovascular: Satisfactory opacification of the pulmonary arteries to the segmental level. No evidence of  pulmonary embolism. Normal heart size. No pericardial effusion. Mediastinum/Nodes: No enlarged mediastinal, hilar, or axillary lymph nodes. Hypodense nodule of the left lobe of the thyroid  measuring 2.6 x 2.5 cm, which deflects the trachea rightward. 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 Esophagus without significant findings. Lungs/Pleura: Trace pleural effusions and bibasilar atelectasis or consolidation. Musculoskeletal: No chest wall abnormality. No acute osseous findings. Review of the MIP images confirms the above findings. CT ABDOMEN PELVIS FINDINGS Hepatobiliary: No solid liver abnormality is seen. Multiple fluid attenuation cysts throughout the liver. No gallstones, gallbladder wall thickening, or biliary dilatation. Pancreas: Unremarkable. No pancreatic ductal dilatation or surrounding inflammatory changes. Spleen: Normal in size without significant abnormality. Adrenals/Urinary Tract: Adrenal glands are unremarkable. Hypodense masslike lesion in the anterior midportion of the right kidney measuring 3.1 x 2.5 cm (series 2, image 65). Right-sided perinephric fat stranding. Heterogeneously enhancing, mixed solid and cystic appearing mass arising from the peripheral superior pole of the left kidney measuring 1.7 x 1.6 cm (series 2, image 32). Calculus at the left ureteropelvic junction measuring 1.2 cm without significant hydronephrosis (series 7, image 85). Nonobstructive calculus in the dependent right renal pelvis. Bladder is unremarkable. Stomach/Bowel: Stomach is within normal limits. Appendix appears normal. No evidence of bowel wall thickening, distention, or inflammatory changes. Vascular/Lymphatic: No significant vascular findings are present. No enlarged abdominal or pelvic lymph nodes. Reproductive: No mass or other significant abnormality. Other: No abdominal wall hernia or abnormality. No ascites. Musculoskeletal: No acute or significant osseous findings. IMPRESSION: 1. Negative  examination for pulmonary embolism. 2. Trace pleural effusions and bibasilar atelectasis or consolidation. 3. Hypodense masslike lesion in the anterior midportion of the right kidney measuring 3.1 x 2.5 cm. Right-sided perinephric fat stranding. This may reflect focal pyelonephritis or alternately a renal mass. Contrast enhanced MRI may be helpful to further characterize. 4. Heterogeneously enhancing, mixed solid and cystic appearing mass arising from the peripheral superior pole of the left kidney measuring 1.7 x 1.6 cm. This is consistent with a renal cell carcinoma. 5. Calculus at the left ureteropelvic junction measuring 1.2 cm without significant hydronephrosis. 6. Nonobstructive calculus in the dependent right renal pelvis. 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 8. Hypodense nodule of the left lobe of the thyroid  measuring 2.6 x 2.5 cm, which deflects the trachea rightward. This can be further characterized by thyroid  ultrasound on a nonemergent, outpatient basis if and when clinically appropriate. Electronically Signed   By: Alex D  Marlyce M.D.   On: 10/25/2023 11:23   DG Chest 2 View Result Date: 10/25/2023 CLINICAL DATA:  Possible sepsis. EXAM: CHEST - 2 VIEW COMPARISON:  None Available. FINDINGS: The heart size and mediastinal contours are within normal limits. Both lungs are clear. The visualized skeletal structures are unremarkable. IMPRESSION: No active cardiopulmonary disease. Electronically Signed   By: Lynwood Landy Raddle M.D.   On: 10/25/2023 07:55     .Critical Care  Performed by: Neldon Hamp RAMAN, PA Authorized by: Neldon Hamp RAMAN, PA   Critical care provider statement:    Critical care time (minutes):  35   Critical care time was exclusive of:  Separately billable procedures and treating other patients and teaching time   Critical care was necessary to treat or prevent imminent or life-threatening deterioration of the following conditions:  Sepsis   Critical care was time spent personally  by me on the following activities:  Development of treatment plan with patient or surrogate, review of old charts, re-evaluation of patient's condition, pulse oximetry, ordering and review of radiographic studies, ordering and review of laboratory studies, ordering and performing treatments and interventions, obtaining history from patient or surrogate, examination of patient and evaluation of patient's response to treatment   Care discussed with: admitting provider      Medications Ordered in the ED  lactated ringers  infusion ( Intravenous New Bag/Given 10/25/23 1429)  cefTRIAXone  (ROCEPHIN ) 2 g in sodium chloride  0.9 % 100 mL IVPB (has no administration in time range)  atorvastatin  (LIPITOR) tablet 10 mg (has no administration in time range)  escitalopram  (LEXAPRO ) tablet 10 mg (has no administration in time range)  methimazole  (TAPAZOLE ) tablet 5 mg (has no administration in time range)  bisacodyl  (DULCOLAX) suppository 10 mg (has no administration in time range)  lactulose  (CHRONULAC ) 10 GM/15ML solution 20 g (has no administration in time range)  polyethylene glycol (MIRALAX  / GLYCOLAX ) packet 17 g (has no administration in time range)  senna-docusate (Senokot-S) tablet 2 tablet (has no administration in time range)  simethicone  (MYLICON) chewable tablet 120 mg (has no administration in time range)  melatonin tablet 3 mg (has no administration in time range)  Vitamin D  (Ergocalciferol ) (DRISDOL ) 1.25 MG (50000 UNIT) capsule 50,000 Units (has no administration in time range)  multivitamin with minerals tablet 1 tablet (has no administration in time range)  enoxaparin  (LOVENOX ) injection 40 mg (has no administration in time range)  sodium chloride  flush (NS) 0.9 % injection 3 mL (3 mLs Intravenous Given 10/25/23 1419)  sodium chloride  flush (NS) 0.9 % injection 3 mL (has no administration in time range)  0.9 %  sodium chloride  infusion (has no administration in time range)  acetaminophen   (TYLENOL ) tablet 650 mg (has no administration in time range)    Or  acetaminophen  (TYLENOL ) suppository 650 mg (has no administration in time range)  ondansetron  (ZOFRAN ) tablet 4 mg ( Oral See Alternative 10/25/23 1415)    Or  ondansetron  (ZOFRAN ) injection 4 mg (4 mg Intravenous Given 10/25/23 1415)  hydrALAZINE  (APRESOLINE ) injection 10 mg (has no administration in time range)  potassium chloride  10 mEq in 100 mL IVPB (10 mEq Intravenous New Bag/Given 10/25/23 1436)  cefTRIAXone  (ROCEPHIN ) 2 g in sodium chloride  0.9 % 100 mL IVPB (2 g Intravenous New Bag/Given 10/25/23 0749)  azithromycin  (ZITHROMAX ) 500 mg in sodium chloride  0.9 % 250 mL IVPB (500 mg Intravenous New Bag/Given 10/25/23 0753)  lactated ringers  bolus 1,000 mL (1,000 mLs Intravenous New Bag/Given 10/25/23 0750)  acetaminophen  (TYLENOL )  tablet 1,000 mg (1,000 mg Oral Given 10/25/23 0746)  potassium chloride  SA (KLOR-CON  M) CR tablet 40 mEq (40 mEq Oral Given 10/25/23 0930)  iohexol  (OMNIPAQUE ) 350 MG/ML injection 100 mL (100 mLs Intravenous Contrast Given 10/25/23 0951)    Clinical Course as of 10/25/23 1436  Tue Oct 25, 2023  0848 Evaluated patient.  Clinically nontoxic-appearing, alert and oriented.  No focal deficits.  Distended abdomen, but soft and nontender.,  Borderline oxygen saturation, but not in respiratory distress.  Family member in room states that she is more sleepy, but at her baseline mentation.  Broad workup underway. [TY]  1133 IMPRESSION: 1. Negative examination for pulmonary embolism. 2. Trace pleural effusions and bibasilar atelectasis or consolidation. 3. Hypodense masslike lesion in the anterior midportion of the right kidney measuring 3.1 x 2.5 cm. Right-sided perinephric fat stranding. This may reflect focal pyelonephritis or alternately a renal mass. Contrast enhanced MRI may be helpful to further characterize. 4. Heterogeneously enhancing, mixed solid and cystic appearing mass arising from the  peripheral superior pole of the left kidney measuring 1.7 x 1.6 cm. This is consistent with a renal cell carcinoma. 5. Calculus at the left ureteropelvic junction measuring 1.2 cm without significant hydronephrosis. 6. Nonobstructive calculus in the dependent right renal pelvis. 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 8. Hypodense nodule of the left lobe of the thyroid  measuring 2.6 x 2.5 cm, which deflects the trachea rightward. This can be further characterized by thyroid  ultrasound on a nonemergent, outpatient basis if and when clinically appropriate.   [WF]  1156 Discussed with [facility] of urology. Will discuss w Dr. Alvaro who is PM coverage. Admit to hospitalist. NPO for now.  [WF]  1301 Discussed with hospitalist who will admit.  Manny of urology will evaluate for possible necessary intervention/stent later.  N.p.o. [WF]    Clinical Course User Index [TY] Neysa Caron PARAS, DO [WF] Neldon Hamp RAMAN, [place]                                 Medical Decision Making Amount and/or Complexity of Data Reviewed Labs: ordered. Radiology: ordered.  Risk OTC drugs. Prescription drug management. Decision regarding hospitalization.    This patient presents to the ED for concern of AMS, this involves a number of treatment options, and is a complaint that carries with it a moderate to high risk of complications and morbidity. A differential diagnosis was considered for the patient's symptoms which is discussed below:   The differential diagnosis for AMS is extensive and includes, but is not limited to: drug overdose - opioids, alcohol, sedatives, antipsychotics, drug withdrawal, others; Metabolic: hypoxia, hypoglycemia, hyperglycemia, hypercalcemia, hypernatremia, hyponatremia, uremia, hepatic encephalopathy, hypothyroidism, hyperthyroidism, vitamin B12 or thiamine deficiency, carbon monoxide poisoning, Wilson's disease, Lactic acidosis, DKA/HHOS; Infectious: meningitis, encephalitis,  bacteremia/sepsis, urinary tract infection, pneumonia, neurosyphilis; Structural: Space-occupying lesion, (brain tumor, subdural hematoma, hydrocephalus,); Vascular: stroke, subarachnoid hemorrhage, coronary ischemia, hypertensive encephalopathy, CNS vasculitis, thrombotic thrombocytopenic purpura, disseminated intravascular coagulation, hyperviscosity; Psychiatric: Schizophrenia, depression; Other: Seizure, hypothermia, heat stroke, dementia    Co morbidities: Discussed in HPI   Brief History:  Patient is a 58 year old female with a past medical history significant for spinal muscular atrophy brought in from facility for patient being more fatigued/lethargic than usual.  At baseline she is bedbound but alert and oriented x 3 and quick to answer questions.  Seems that this morning she was answering questions by shaking her head yes or no.  Found to  have a fever and transported by EMS she received 1 L of normal saline in transport.  She denies any pain on my examination she does indicate that she has been urinating more frequently and has had some dysuria.  Specifically she denies any flank pain or nausea or vomiting.  Has not had any diarrhea.  Does not have a Foley catheter.  Normally wears adult diaper.  She does not have any history of wounds or cellulitis.    EMR reviewed including pt PMHx, past surgical history and past visits to ER.   See HPI for more details   Lab Tests:   I ordered and independently interpreted labs. Labs notable for Leukocytosis of 12.5, anemic but not severely so, UA with many bacteria, greater than 50 WBCs positive for nitrates and leukocytes and hemoglobin, CMP with hypokalemia which was repleted orally no evidence of AKI.  Low total protein and albumin consistent with poor p.o. intake from patient recently.  Lactate normal COVID flu RSV negative  Imaging Studies:  Abnormal findings. I personally reviewed all imaging studies. Imaging notable for Renal  mass, left sided ureterolithiasis, thyroid  mass   Cardiac Monitoring:  The patient was maintained on a cardiac monitor.  I personally viewed and interpreted the cardiac monitored which showed an underlying rhythm of: Sinus tachycardia EKG non-ischemic sinus tachycardia    Medicines ordered:  I ordered medication including oral potassium, azithromycin , Rocephin , lactated Ringer 's, Tylenol  for treatment of UTI versus CAP Reevaluation of the patient after these medicines showed that the patient improved I have reviewed the patients home medicines and have made adjustments as needed   Critical Interventions:   treatment of sepsis   Consults/Attending Physician   I requested consultation with urology,  and discussed lab and imaging findings as well as pertinent plan - they recommend: Admission to hospital service, will evaluate patient for possible intervention keep NPO.  Discussed with Dr. Sonjia who will admit.  I discussed this case with my attending physician who cosigned this note including patient's presenting symptoms, physical exam, and planned diagnostics and interventions. Attending physician stated agreement with plan or made changes to plan which were implemented.   Attending physician assessed patient at bedside.   Reevaluation:  After the interventions noted above I re-evaluated patient and found that they have :improved   Social Determinants of Health:      Problem List / ED Course:  Sepsis likely urosepsis in the setting of UTI symptoms, urinalysis concerning for UTI and patient with possibly obstructing stone urology aware of patient and is considering stent. Renal cell carcinoma -possible renal cell carcinoma found on CT scan.  Urology aware of this and recommendations pending. Thyroid  mass.  Patient aware of this and added to problem list. Ultimately status -has improved.  Patient seems to be at her mental baseline.   Dispostion:  After  consideration of the diagnostic results and the patients response to treatment, I feel that the patent would benefit from admission.   Final diagnoses:  Acute cystitis with hematuria  Altered mental status, unspecified altered mental status type  Renal mass  Thyroid  mass    ED Discharge Orders     None          Neldon Hamp RAMAN, [place] 10/25/23 1436    Neysa Caron PARAS, DO 10/25/23 1526

## 2023-10-25 NOTE — Sepsis Progress Note (Signed)
 Sepsis protocol monitored by eLink

## 2023-10-25 NOTE — Anesthesia Preprocedure Evaluation (Addendum)
 Anesthesia Evaluation  Patient identified by MRN, date of birth, ID band Patient awake and Patient confused    Reviewed: Allergy & Precautions, NPO status , Patient's Chart, lab work & pertinent test results  History of Anesthesia Complications Negative for: history of anesthetic complications  Airway Mallampati: II  TM Distance: >3 FB Neck ROM: Full    Dental  (+) Missing,    Pulmonary  On 3L [place]   breath sounds clear to auscultation       Cardiovascular hypertension, Pt. on medications Normal cardiovascular exam     Neuro/Psych       Dementia Charcot Marie Tooth    GI/Hepatic Neg liver ROS,,,N/V today   Endo/Other   Hyperthyroidism   Renal/GU Bilateral ureteral stone, K 2.8  negative genitourinary   Musculoskeletal negative musculoskeletal ROS (+)    Abdominal   Peds  Hematology  (+) Blood dyscrasia (Hgb 10.2), anemia   Anesthesia Other Findings Sepsis from urinary source  Reproductive/Obstetrics negative OB ROS                              Anesthesia Physical Anesthesia Plan  ASA: 3 and emergent  Anesthesia Plan: General   Post-op Pain Management: Tylenol  PO (pre-op)*   Induction: Intravenous and Rapid sequence  PONV Risk Score and Plan: 3 and Treatment may vary due to age or medical condition, Ondansetron  and Dexamethasone   Airway Management Planned: Oral ETT  Additional Equipment: None  Intra-op Plan:   Post-operative Plan: Extubation in OR  Informed Consent: I have reviewed the patients History and Physical, chart, labs and discussed the procedure including the risks, benefits and alternatives for the proposed anesthesia with the patient or authorized representative who has indicated his/her understanding and acceptance.   Patient has DNR.  Discussed DNR with power of attorney, Discussed DNR with patient and Continue DNR.   Dental advisory given and Consent  reviewed with POA  Plan Discussed with: CRNA  Anesthesia Plan Comments: (Consent reviewed with patient and her sister in preop. Pt's sister states that patient would not want chest compressions in the event of cardiac arrest but would be agreeable to defibrillation and medical management. All questions answered. Law[place], MD)         Anesthesia Quick Evaluation

## 2023-10-26 ENCOUNTER — Encounter (HOSPITAL_COMMUNITY): Payer: Self-pay | Admitting: Urology

## 2023-10-26 DIAGNOSIS — Z66 Do not resuscitate: Secondary | ICD-10-CM | POA: Diagnosis present

## 2023-10-26 DIAGNOSIS — J90 Pleural effusion, not elsewhere classified: Secondary | ICD-10-CM | POA: Diagnosis present

## 2023-10-26 DIAGNOSIS — Z1612 Extended spectrum beta lactamase (ESBL) resistance: Secondary | ICD-10-CM | POA: Diagnosis present

## 2023-10-26 DIAGNOSIS — A4151 Sepsis due to Escherichia coli [E. coli]: Secondary | ICD-10-CM | POA: Diagnosis present

## 2023-10-26 DIAGNOSIS — J9811 Atelectasis: Secondary | ICD-10-CM | POA: Diagnosis present

## 2023-10-26 DIAGNOSIS — N390 Urinary tract infection, site not specified: Secondary | ICD-10-CM | POA: Diagnosis not present

## 2023-10-26 DIAGNOSIS — E0590 Thyrotoxicosis, unspecified without thyrotoxic crisis or storm: Secondary | ICD-10-CM | POA: Diagnosis present

## 2023-10-26 DIAGNOSIS — Z79899 Other long term (current) drug therapy: Secondary | ICD-10-CM | POA: Diagnosis not present

## 2023-10-26 DIAGNOSIS — N200 Calculus of kidney: Secondary | ICD-10-CM | POA: Diagnosis present

## 2023-10-26 DIAGNOSIS — C642 Malignant neoplasm of left kidney, except renal pelvis: Secondary | ICD-10-CM | POA: Diagnosis present

## 2023-10-26 DIAGNOSIS — R0902 Hypoxemia: Secondary | ICD-10-CM | POA: Diagnosis present

## 2023-10-26 DIAGNOSIS — A419 Sepsis, unspecified organism: Secondary | ICD-10-CM | POA: Diagnosis not present

## 2023-10-26 DIAGNOSIS — N202 Calculus of kidney with calculus of ureter: Secondary | ICD-10-CM | POA: Diagnosis present

## 2023-10-26 DIAGNOSIS — G600 Hereditary motor and sensory neuropathy: Secondary | ICD-10-CM | POA: Diagnosis present

## 2023-10-26 DIAGNOSIS — G121 Other inherited spinal muscular atrophy: Secondary | ICD-10-CM | POA: Diagnosis present

## 2023-10-26 DIAGNOSIS — Z7401 Bed confinement status: Secondary | ICD-10-CM | POA: Diagnosis not present

## 2023-10-26 DIAGNOSIS — Z8249 Family history of ischemic heart disease and other diseases of the circulatory system: Secondary | ICD-10-CM | POA: Diagnosis not present

## 2023-10-26 DIAGNOSIS — E669 Obesity, unspecified: Secondary | ICD-10-CM | POA: Diagnosis present

## 2023-10-26 DIAGNOSIS — Z87442 Personal history of urinary calculi: Secondary | ICD-10-CM | POA: Diagnosis not present

## 2023-10-26 DIAGNOSIS — B9620 Unspecified Escherichia coli [E. coli] as the cause of diseases classified elsewhere: Secondary | ICD-10-CM | POA: Diagnosis not present

## 2023-10-26 DIAGNOSIS — Z87440 Personal history of urinary (tract) infections: Secondary | ICD-10-CM | POA: Diagnosis not present

## 2023-10-26 DIAGNOSIS — N136 Pyonephrosis: Secondary | ICD-10-CM | POA: Diagnosis present

## 2023-10-26 DIAGNOSIS — Z791 Long term (current) use of non-steroidal anti-inflammatories (NSAID): Secondary | ICD-10-CM | POA: Diagnosis not present

## 2023-10-26 DIAGNOSIS — E876 Hypokalemia: Secondary | ICD-10-CM | POA: Diagnosis present

## 2023-10-26 DIAGNOSIS — E785 Hyperlipidemia, unspecified: Secondary | ICD-10-CM | POA: Diagnosis present

## 2023-10-26 DIAGNOSIS — E039 Hypothyroidism, unspecified: Secondary | ICD-10-CM | POA: Diagnosis present

## 2023-10-26 DIAGNOSIS — I10 Essential (primary) hypertension: Secondary | ICD-10-CM | POA: Diagnosis present

## 2023-10-26 LAB — CBC
HCT: 37.9 % (ref 36.0–46.0)
Hemoglobin: 11.3 g/dL — ABNORMAL LOW (ref 12.0–15.0)
MCH: 27.7 pg (ref 26.0–34.0)
MCHC: 29.8 g/dL — ABNORMAL LOW (ref 30.0–36.0)
MCV: 92.9 fL (ref 80.0–100.0)
Platelets: 288 K/uL (ref 150–400)
RBC: 4.08 MIL/uL (ref 3.87–5.11)
RDW: 15.2 % (ref 11.5–15.5)
WBC: 12.2 K/uL — ABNORMAL HIGH (ref 4.0–10.5)
nRBC: 0 % (ref 0.0–0.2)

## 2023-10-26 LAB — BASIC METABOLIC PANEL WITH GFR
Anion gap: 14 (ref 5–15)
BUN: 9 mg/dL (ref 6–20)
CO2: 21 mmol/L — ABNORMAL LOW (ref 22–32)
Calcium: 9.2 mg/dL (ref 8.9–10.3)
Chloride: 104 mmol/L (ref 98–111)
Creatinine, Ser: 0.54 mg/dL (ref 0.44–1.00)
GFR, Estimated: >60 mL/min (ref >60)
Glucose, Bld: 142 mg/dL — ABNORMAL HIGH (ref 70–99)
Potassium: 4.3 mmol/L (ref 3.5–5.1)
Sodium: 138 mmol/L (ref 135–145)

## 2023-10-26 LAB — PHOSPHORUS: Phosphorus: 2.5 mg/dL (ref 2.5–4.6)

## 2023-10-26 LAB — MAGNESIUM: Magnesium: 1.9 mg/dL (ref 1.7–2.4)

## 2023-10-26 MED ORDER — CHLORHEXIDINE GLUCONATE CLOTH 2 % EX PADS
6.0000 | MEDICATED_PAD | Freq: Every day | CUTANEOUS | Status: DC
  Administered 2023-10-26 – 2023-10-28 (×3): 6 via TOPICAL

## 2023-10-26 NOTE — Progress Notes (Signed)
 OT Cancellation Note  Patient Details Name: Sharon Bruce MRN: [id number] DOB: 16-Aug-1965   Cancelled Treatment:    Reason Eval/Treat Not Completed: OT screened, no needs identified, will sign off. Pt received with RN in room. [facility] from [facility], is [facility], uses [facility] lift at for transfers with staff OOB. Pt demonstrates ability to self-feed, pt at functional baseline with no acute OT needs. OT to complete orders, thank you for the referral.   Delon L. Jullian Previti, OTR/L  10/26/23, 8:50 AM

## 2023-10-26 NOTE — Progress Notes (Signed)
 PROGRESS NOTE  Sharon Bruce FMW:[id number] DOB: 04-30-1965 DOA: 10/25/2023 PCP: Yolande Toribio MATSU, MD   LOS: 0 days   Brief narrative:  Patient is a 58 years old female with past medical history of spinal muscular atrophy, hypertension bedbound status from assisted living facility with past medical history presented to the hospital with lethargy and generalized weakness..In the ED patient was noted to be febrile with a temperature of 100.9 F and was tachycardic and slightly tachypneic. WBC elevation at 12.5 with neutrophilia.  BMP showed significant hypokalemia with potassium of 2.8 urinalysis showed nitrite positive more than 50 RBC and white cells.  Lactate of 0.6.   CT scan of the abdomen and pelvis showed hypodense masslike lesion in the right kidney, cystic and solid mass in the superior pole of the left kidney consistent with renal cell carcinoma, calculus of the left ureteropelvic junction measuring around 1.2 cm without significant hydronephrosis and calculus in the right renal pelvis.  Urology was consulted, blood cultures and urine cultures were sent from the ED and patient was considered for admission to the hospital for further evaluation and treatment.    Assessment/Plan: Principal Problem:   Sepsis secondary to UTI [facility]) Active Problems:   Charcot Marie Tooth muscular atrophy   Spinocerebellar ataxia (HCC)   Poor vision   Renal calculus   Hyperthyroidism   Hypokalemia   Renal mass   Thyroid  mass   UTI (urinary tract infection)  Sepsis secondary to UTI secondary to left ureteropelvic junction calculus Patient had fever, leukocytosis, tachycardia tachypnea on presentation with significant urinary abnormality suggestive of sepsis secondary to UTI.  Imaging without significant hydronephrosis.  Seen by urology and has undergone bilateral ureteric stent placement.  Received IV hydration.  Blood cultures negative in less than 24 hours, urine culture pending.  Significant  hypokalemia.  Potassium of 2.4 at presentation.  Aggressive potassium supplementation with latest potassium of 4.3.  Received  Ureteric stones history of  recurrent UTI Seen by urology and is status post ureteric stent bilaterally.  Continue IV antibiotics and follow cultures.  Cystic and solid mass in the left kidney.  Could not rule out renal cell carcinoma.  Urology has been consulted.  Will need to follow-up with urology as outpatient.  Right renal mass.  Will need follow-up.  Hyperlipidemia.  On statin at home.  Will hold off for now.  Hypertension.  On losartan .  Will hold off for now.  Put on as needed hydralazine .  Hyperthyroidism.  On methimazole .  TSH of 0.8 at this time.  Incidental thyroid  nodule noted on the CT scan of the chest.  Will need outpatient follow-up.  On methimazole  for hyperthyroidism  History of bedbound status from muscular atrophy. From assisted living facility continue supportive care.  Will get PT OT evaluation.  DVT prophylaxis: enoxaparin  (LOVENOX ) injection 40 mg Start: 10/26/23 1000   Disposition: Assisted living facility in 1 to 2 days  Status is: Inpatient Remains inpatient appropriate because: Pending clinical improvement, pending cultures    Code Status:     Code Status: Limited: Do not attempt resuscitation (DNR) -DNR-LIMITED -Do Not Intubate/DNI   Family Communication: Spoke with the patient's sister at bedside  Consultants: Urology  Procedures: Cystoscopy with bilateral ureteric stent placement on 10/25/2023  Anti-infectives:  Rocephin  IV  Anti-infectives (From admission, onward)    Start     Dose/Rate Route Frequency Ordered Stop   10/26/23 0800  cefTRIAXone  (ROCEPHIN ) 2 g in sodium chloride  0.9 % 100 mL IVPB  2 g 200 mL/hr over 30 Minutes Intravenous Every 24 hours 10/25/23 1342     10/25/23 0715  cefTRIAXone  (ROCEPHIN ) 2 g in sodium chloride  0.9 % 100 mL IVPB        2 g 200 mL/hr over 30 Minutes Intravenous Once  10/25/23 0705 10/25/23 0819   10/25/23 0715  azithromycin  (ZITHROMAX ) 500 mg in sodium chloride  0.9 % 250 mL IVPB        500 mg 250 mL/hr over 60 Minutes Intravenous  Once 10/25/23 0705 10/25/23 0853        Subjective: Today, patient was seen and examined at bedside.  Patient appears to be more alert awake and communicative today.  Patient's mom at the bedside denies any pain, nausea, vomiting, fevers or chills.  Had fever yesterday  Objective: Vitals:   10/26/23 0044 10/26/23 0431  BP: 131/76 128/73  Pulse: 69 68  Resp: 18 19  Temp: 97.7 F (36.5 C) 97.7 F (36.5 C)  SpO2: 97% 96%    Intake/Output Summary (Last 24 hours) at 10/26/2023 1148 Last data filed at 10/26/2023 0500 Gross per 24 hour  Intake 1799.61 ml  Output 1500 ml  Net 299.61 ml   Filed Weights   10/25/23 1748 10/26/23 0500  Weight: 77.1 kg 78.9 kg   Body mass index is 27.25 kg/m.   Physical Exam: GENERAL: Patient is alert awake and Communicative, slurred speech,. Not in obvious distress. HENT: No scleral pallor or icterus. Pupils equally reactive to light. Oral mucosa is moist NECK: is supple, no gross swelling noted. CHEST: Decreased breath sounds bilaterally. CVS: S1 and S2 heard, no murmur. Regular rate and rhythm.  ABDOMEN: Soft, non-tender, bowel sounds are present. EXTREMITIES: No edema. CNS: Muscular atrophy noted.  Weakness of the upper extremities.  Unable to move lower extremities. SKIN: warm and dry without rashes.  Data Review: I have personally reviewed the following laboratory data and studies,  CBC: Recent Labs  Lab 10/25/23 0738 10/26/23 0522  WBC 12.5* 12.2*  NEUTROABS 10.9*  --   HGB 10.2* 11.3*  HCT 33.1* 37.9  MCV 94.0 92.9  PLT 248 288   Basic Metabolic Panel: Recent Labs  Lab 10/25/23 0738 10/26/23 0522  NA 141 138  K 2.8* 4.3  CL 112* 104  CO2 20* 21*  GLUCOSE 101* 142*  BUN 15 9  CREATININE 0.36* 0.54  CALCIUM  7.2* 9.2  MG  --  1.9  PHOS  --  2.5    Liver Function Tests: Recent Labs  Lab 10/25/23 0738  AST 11*  ALT 9  ALKPHOS 62  BILITOT 0.8  PROT 4.6*  ALBUMIN 2.5*   No results for input(s): LIPASE, AMYLASE in the last 168 hours. No results for input(s): AMMONIA in the last 168 hours. Cardiac Enzymes: No results for input(s): CKTOTAL, CKMB, CKMBINDEX, TROPONINI in the last 168 hours. BNP (last 3 results) No results for input(s): BNP in the last 8760 hours.  ProBNP (last 3 results) No results for input(s): PROBNP in the last 8760 hours.  CBG: No results for input(s): GLUCAP in the last 168 hours. Recent Results (from the past 240 hours)  Blood Culture (routine x 2)     Status: None (Preliminary result)   Collection Time: 10/25/23  7:38 AM   Specimen: BLOOD  Result Value Ref Range Status   Specimen Description   Final    BLOOD BLOOD RIGHT ARM Performed at [facility], [address] [place], [place] [address]  Special Requests   Final    BOTTLES DRAWN AEROBIC AND ANAEROBIC Blood Culture adequate volume Performed at [facility], [address], [place], [place] [address]    Culture   Final    NO GROWTH < 24 HOURS Performed at [facility], [address], [place], [place] [address]    Report Status PENDING  Incomplete  Resp panel by RT-PCR (RSV, Flu A&B, Covid) Anterior Nasal Swab     Status: None   Collection Time: 10/25/23  7:47 AM   Specimen: Anterior Nasal Swab  Result Value Ref Range Status   SARS Coronavirus 2 by RT PCR NEGATIVE NEGATIVE Final    Comment: (NOTE) SARS-CoV-2 target nucleic acids are NOT DETECTED.  The SARS-CoV-2 RNA is generally detectable in upper respiratory specimens during the acute phase of infection. The lowest concentration of SARS-CoV-2 viral copies this assay can detect is 138 copies/mL. A negative result does not preclude SARS-Cov-2 infection and should not be used as the sole basis for treatment or other  patient management decisions. A negative result may occur with  improper specimen collection/handling, submission of specimen other than nasopharyngeal swab, presence of viral mutation(s) within the areas targeted by this assay, and inadequate number of viral copies(<138 copies/mL). A negative result must be combined with clinical observations, patient history, and epidemiological information. The expected result is Negative.  Fact Sheet for Patients:  BloggerCourse.com  Fact Sheet for Healthcare Providers:  SeriousBroker.it  This test is no t yet approved or cleared by the [place]  FDA and  has been authorized for detection and/or diagnosis of SARS-CoV-2 by FDA under an Emergency Use Authorization (EUA). This EUA will remain  in effect (meaning this test can be used) for the duration of the COVID-19 declaration under Section 564(b)(1) of the Act, 21 U.S.C.section 360bbb-3(b)(1), unless the authorization is terminated  or revoked sooner.       Influenza A by PCR NEGATIVE NEGATIVE Final   Influenza B by PCR NEGATIVE NEGATIVE Final    Comment: (NOTE) The Xpert Xpress SARS-CoV-2/FLU/RSV plus assay is intended as an aid in the diagnosis of influenza from Nasopharyngeal swab specimens and should not be used as a sole basis for treatment. Nasal washings and aspirates are unacceptable for Xpert Xpress SARS-CoV-2/FLU/RSV testing.  Fact Sheet for Patients: BloggerCourse.com  Fact Sheet for Healthcare Providers: SeriousBroker.it  This test is not yet approved or cleared by the [place]  FDA and has been authorized for detection and/or diagnosis of SARS-CoV-2 by FDA under an Emergency Use Authorization (EUA). This EUA will remain in effect (meaning this test can be used) for the duration of the COVID-19 declaration under Section 564(b)(1) of the Act, 21 U.S.C. section  360bbb-3(b)(1), unless the authorization is terminated or revoked.     Resp Syncytial Virus by PCR NEGATIVE NEGATIVE Final    Comment: (NOTE) Fact Sheet for Patients: BloggerCourse.com  Fact Sheet for Healthcare Providers: SeriousBroker.it  This test is not yet approved or cleared by the [place]  FDA and has been authorized for detection and/or diagnosis of SARS-CoV-2 by FDA under an Emergency Use Authorization (EUA). This EUA will remain in effect (meaning this test can be used) for the duration of the COVID-19 declaration under Section 564(b)(1) of the Act, 21 U.S.C. section 360bbb-3(b)(1), unless the authorization is terminated or revoked.  Performed at [facility], [address] [place], [place] [address]   Blood Culture (routine x 2)     Status: None (Preliminary  result)   Collection Time: 10/25/23  7:48 AM   Specimen: BLOOD  Result Value Ref Range Status   Specimen Description   Final    BLOOD BLOOD LEFT FOREARM Performed at [facility], [address], [place], [place] [address]    Special Requests   Final    BOTTLES DRAWN AEROBIC AND ANAEROBIC Blood Culture adequate volume Performed at [facility], [address], [place], [place] [address]    Culture   Final    NO GROWTH < 24 HOURS Performed at [facility], [address] [place], [place] [address]    Report Status PENDING  Incomplete  MRSA Next Gen by PCR, Nasal     Status: None   Collection Time: 10/25/23  3:34 PM   Specimen: Nasal Mucosa; Nasal Swab  Result Value Ref Range Status   MRSA by PCR Next Gen NOT DETECTED NOT DETECTED Final    Comment: (NOTE) The GeneXpert MRSA Assay (FDA approved for NASAL specimens only), is one component of a comprehensive MRSA colonization surveillance program. It is not intended to diagnose MRSA infection nor to guide or monitor treatment for MRSA  infections. Test performance is not FDA approved in patients less than 31 years old. Performed at [facility], [address], [place], [place] [address]      Studies: DG C-Arm 1-60 Min-No Report Result Date: 10/25/2023 Fluoroscopy was utilized by the requesting physician.  No radiographic interpretation.   CT Angio Chest PE W/Cm &/Or Wo Cm Result Date: 10/25/2023 CLINICAL DATA:  PE suspected, versus pneumonia, lethargy, abdominal pain * Tracking Code: BO * EXAM: CT ANGIOGRAPHY CHEST CT ABDOMEN AND PELVIS WITH CONTRAST TECHNIQUE: Multidetector CT imaging of the chest was performed using the standard protocol during bolus administration of intravenous contrast. Multiplanar CT image reconstructions and MIPs were obtained to evaluate the vascular anatomy. Multidetector CT imaging of the abdomen and pelvis was performed using the standard protocol during bolus administration of intravenous contrast. RADIATION DOSE REDUCTION: This exam was performed according to the departmental dose-optimization program which includes automated exposure control, adjustment of the mA and/or kV according to patient size and/or use of iterative reconstruction technique. CONTRAST:  OMNIPAQUE  IOHEXOL  350 MG/ML SOLN COMPARISON:  None Available. FINDINGS: CT CHEST ANGIOGRAM FINDINGS Cardiovascular: Satisfactory opacification of the pulmonary arteries to the segmental level. No evidence of pulmonary embolism. Normal heart size. No pericardial effusion. Mediastinum/Nodes: No enlarged mediastinal, hilar, or axillary lymph nodes. Hypodense nodule of the left lobe of the thyroid  measuring 2.6 x 2.5 cm, which deflects the trachea rightward. 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 Esophagus without significant findings. Lungs/Pleura: Trace pleural effusions and bibasilar atelectasis or consolidation. Musculoskeletal: No chest wall abnormality. No acute osseous findings. Review of the MIP images  confirms the above findings. CT ABDOMEN PELVIS FINDINGS Hepatobiliary: No solid liver abnormality is seen. Multiple fluid attenuation cysts throughout the liver. No gallstones, gallbladder wall thickening, or biliary dilatation. Pancreas: Unremarkable. No pancreatic ductal dilatation or surrounding inflammatory changes. Spleen: Normal in size without significant abnormality. Adrenals/Urinary Tract: Adrenal glands are unremarkable. Hypodense masslike lesion in the anterior midportion of the right kidney measuring 3.1 x 2.5 cm (series 2, image 65). Right-sided perinephric fat stranding. Heterogeneously enhancing, mixed solid and cystic appearing mass arising from the peripheral superior pole of the left kidney measuring 1.7 x 1.6 cm (series 2, image 32). Calculus at the left ureteropelvic junction measuring 1.2 cm without significant hydronephrosis (series 7, image 85).  Nonobstructive calculus in the dependent right renal pelvis. Bladder is unremarkable. Stomach/Bowel: Stomach is within normal limits. Appendix appears normal. No evidence of bowel wall thickening, distention, or inflammatory changes. Vascular/Lymphatic: No significant vascular findings are present. No enlarged abdominal or pelvic lymph nodes. Reproductive: No mass or other significant abnormality. Other: No abdominal wall hernia or abnormality. No ascites. Musculoskeletal: No acute or significant osseous findings. IMPRESSION: 1. Negative examination for pulmonary embolism. 2. Trace pleural effusions and bibasilar atelectasis or consolidation. 3. Hypodense masslike lesion in the anterior midportion of the right kidney measuring 3.1 x 2.5 cm. Right-sided perinephric fat stranding. This may reflect focal pyelonephritis or alternately a renal mass. Contrast enhanced MRI may be helpful to further characterize. 4. Heterogeneously enhancing, mixed solid and cystic appearing mass arising from the peripheral superior pole of the left kidney measuring 1.7 x 1.6  cm. This is consistent with a renal cell carcinoma. 5. Calculus at the left ureteropelvic junction measuring 1.2 cm without significant hydronephrosis. 6. Nonobstructive calculus in the dependent right renal pelvis. 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 8. Hypodense nodule of the left lobe of the thyroid  measuring 2.6 x 2.5 cm, which deflects the trachea rightward. This can be further characterized by thyroid  ultrasound on a nonemergent, outpatient basis if and when clinically appropriate. Electronically Signed   By: Marolyn JONETTA Jaksch M.D.   On: 10/25/2023 11:23   CT ABDOMEN PELVIS W CONTRAST Result Date: 10/25/2023 CLINICAL DATA:  PE suspected, versus pneumonia, lethargy, abdominal pain * Tracking Code: BO * EXAM: CT ANGIOGRAPHY CHEST CT ABDOMEN AND PELVIS WITH CONTRAST TECHNIQUE: Multidetector CT imaging of the chest was performed using the standard protocol during bolus administration of intravenous contrast. Multiplanar CT image reconstructions and MIPs were obtained to evaluate the vascular anatomy. Multidetector CT imaging of the abdomen and pelvis was performed using the standard protocol during bolus administration of intravenous contrast. RADIATION DOSE REDUCTION: This exam was performed according to the departmental dose-optimization program which includes automated exposure control, adjustment of the mA and/or kV according to patient size and/or use of iterative reconstruction technique. CONTRAST:  OMNIPAQUE  IOHEXOL  350 MG/ML SOLN COMPARISON:  None Available. FINDINGS: CT CHEST ANGIOGRAM FINDINGS Cardiovascular: Satisfactory opacification of the pulmonary arteries to the segmental level. No evidence of pulmonary embolism. Normal heart size. No pericardial effusion. Mediastinum/Nodes: No enlarged mediastinal, hilar, or axillary lymph nodes. Hypodense nodule of the left lobe of the thyroid  measuring 2.6 x 2.5 cm, which deflects the trachea rightward. 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  Esophagus without significant findings. Lungs/Pleura: Trace pleural effusions and bibasilar atelectasis or consolidation. Musculoskeletal: No chest wall abnormality. No acute osseous findings. Review of the MIP images confirms the above findings. CT ABDOMEN PELVIS FINDINGS Hepatobiliary: No solid liver abnormality is seen. Multiple fluid attenuation cysts throughout the liver. No gallstones, gallbladder wall thickening, or biliary dilatation. Pancreas: Unremarkable. No pancreatic ductal dilatation or surrounding inflammatory changes. Spleen: Normal in size without significant abnormality. Adrenals/Urinary Tract: Adrenal glands are unremarkable. Hypodense masslike lesion in the anterior midportion of the right kidney measuring 3.1 x 2.5 cm (series 2, image 65). Right-sided perinephric fat stranding. Heterogeneously enhancing, mixed solid and cystic appearing mass arising from the peripheral superior pole of the left kidney measuring 1.7 x 1.6 cm (series 2, image 32). Calculus at the left ureteropelvic junction measuring 1.2 cm without significant hydronephrosis (series 7, image 85). Nonobstructive calculus in the dependent right renal pelvis. Bladder is unremarkable. Stomach/Bowel: Stomach is within normal limits. Appendix appears normal. No evidence of bowel wall  thickening, distention, or inflammatory changes. Vascular/Lymphatic: No significant vascular findings are present. No enlarged abdominal or pelvic lymph nodes. Reproductive: No mass or other significant abnormality. Other: No abdominal wall hernia or abnormality. No ascites. Musculoskeletal: No acute or significant osseous findings. IMPRESSION: 1. Negative examination for pulmonary embolism. 2. Trace pleural effusions and bibasilar atelectasis or consolidation. 3. Hypodense masslike lesion in the anterior midportion of the right kidney measuring 3.1 x 2.5 cm. Right-sided perinephric fat stranding. This may reflect focal pyelonephritis or alternately a renal  mass. Contrast enhanced MRI may be helpful to further characterize. 4. Heterogeneously enhancing, mixed solid and cystic appearing mass arising from the peripheral superior pole of the left kidney measuring 1.7 x 1.6 cm. This is consistent with a renal cell carcinoma. 5. Calculus at the left ureteropelvic junction measuring 1.2 cm without significant hydronephrosis. 6. Nonobstructive calculus in the dependent right renal pelvis. 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 8. Hypodense nodule of the left lobe of the thyroid  measuring 2.6 x 2.5 cm, which deflects the trachea rightward. This can be further characterized by thyroid  ultrasound on a nonemergent, outpatient basis if and when clinically appropriate. Electronically Signed   By: Marolyn JONETTA Jaksch M.D.   On: 10/25/2023 11:23   DG Chest 2 View Result Date: 10/25/2023 CLINICAL DATA:  Possible sepsis. EXAM: CHEST - 2 VIEW COMPARISON:  None Available. FINDINGS: The heart size and mediastinal contours are within normal limits. Both lungs are clear. The visualized skeletal structures are unremarkable. IMPRESSION: No active cardiopulmonary disease. Electronically Signed   By: Lynwood Landy Raddle M.D.   On: 10/25/2023 07:55      Vernal Alstrom, MD  [facility] 10/26/2023  If 7PM-7AM, please contact night-coverage

## 2023-10-26 NOTE — Op Note (Signed)
 NAME: Sharon Bruce, Sharon Bruce MEDICAL RECORD NO: [id number] ACCOUNT NO: [id number] DATE OF BIRTH: 12-14-1965 FACILITY: [facility] LOCATION: [id number] PHYSICIAN: Ricardo Likens, MD  Operative Report   DATE OF PROCEDURE: 10/25/2023  PREOPERATIVE DIAGNOSES: Left UPJ, bilateral renal stones and urosepsis.  PROCEDURE PERFORMED:  1. Cystoscopy, bilateral retrograde pyelograms with interpretation. 2. Insertion of bilateral ureteral stents.  ESTIMATED BLOOD LOSS:  Nil.  COMPLICATIONS:  None.  SPECIMENS:  None.  FINDINGS: 1. Left mild hydronephrosis to UPJ stone. 2. Unremarkable right retrograde pyelogram. 3. Successful placement of bilateral ureteral stents, proximal end in renal pelvis, distal end in urinary bladder.  DRAINS:  Foley catheter to straight drain.  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 She is bedbound and lives in the facility at baseline. She was found on  workup of sepsis to have significant bacteruria and left UPJ stone with hydronephrosis and concerning with urinary source with obstruction. A small right renal stone was noted as well. Options were discussed including recommended path of urgent renal  decompression on admission for IV antibiotics. We discussed methods of decompression including stenting. We agreed on this being most favorable given her stone size and she presents for this now. Informed consent was obtained and placed in the medical  record.  DESCRIPTION OF PROCEDURE: The patient being herself verified, procedure being cysto, bilateral retrograde and stent placement confirmed. Procedure timeout was performed.  Intravenous antibiotics were administered.  General anesthesia was induced.  The  patient was placed into a low lithotomy position.  Sterile field was created, carefully prepping and draping the patient's vagina, introitus and  proximal thighs using iodine.  Cystourethroscopy was performed using 21-French rigid cystoscope with offset  lens.  Inspection of urinary bladder revealed some proteinaceous-appearing urine. Bilateral ureteral orifices were single. Left ureteral orifice was cannulated with a 6-French renal catheter and left retrograde pyelogram was obtained.  Left retrograde pyelogram demonstrated a single left ureter with single system left kidney.  There was a filling defect in the UPJ area consistent with a stone with mild to moderate hydronephrosis above this. A 0.038 Sensor wire was advanced to the level  of the lower pole over which a new 5 x 24 Polaris type stent was placed. I was not satisfied with the proximal curl on this; therefore, it was exchanged over the wire for a 5 x 26 Polaris type stent, which then resulted in excellent deployment well  above the area of obstruction and distally within the level of the urinary bladder. Next, right retrograde pyelogram was obtained.  Right retrograde pyelogram demonstrated a single right ureter, single system right kidney. No filling defects or narrowing was noted. Sensor wire was advanced and a separate 5 x 26 Polaris type stent was placed on the right side using fluoroscopic  guidance. Good proximal and distal planes were noted.  Given the patient's infectious parameters, Foley catheter was placed per urethra to straight drain, 10 mL of sterile water  in the balloon for maximal decompression and the procedure was terminated.  The patient tolerated the procedure well with no immediate perioperative complications. The patient taken to postanesthesia care unit in stable condition with plan for medical admission and then definitive stone management likely in several weeks after  she clears her infectious parameters and is back to baseline.       PAA D: 10/25/2023 6:45:34 pm T: 10/26/2023 2:13:00 am  JOB: [id number]/ [id number]

## 2023-10-26 NOTE — NC FL2 (Signed)
 [place]  MEDICAID FL2 LEVEL OF CARE FORM     IDENTIFICATION  Patient Name: Sharon Bruce Birthdate: 1965/05/17 Sex: female Admission Date (Current Location): 10/25/2023  [facility] and [place] Number:  Producer, television/film/video and Address:  [facility],  [address], [place] [address]      Provider Number: [id number]  Attending Physician Name and Address:  Sonjia Held, MD  Relative Name and Phone Number:  karen Robertson(sister)[phone]    Current Level of Care: Hospital Recommended Level of Care: Nursing Facility Prior Approval Number:    Date Approved/Denied:   PASRR Number:    Discharge Plan: Other (Comment) (LTC)    Current Diagnoses: Patient Active Problem List   Diagnosis Date Noted   UTI (urinary tract infection) 10/26/2023   Renal calculus 10/25/2023   Hyperthyroidism 10/25/2023   Hypokalemia 10/25/2023   Sepsis secondary to UTI (HCC) 10/25/2023   Renal mass 10/25/2023   Thyroid  mass 10/25/2023   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   Poor vision 11/11/2014   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   Weakness 09/29/2009    Orientation RESPIRATION BLADDER Height & Weight     Self, Time, Situation, Place  Normal Incontinent Weight: 78.9 kg Height:  5' 7 (170.2 cm)  BEHAVIORAL SYMPTOMS/MOOD NEUROLOGICAL BOWEL NUTRITION STATUS      Incontinent Diet (Regular)  AMBULATORY STATUS COMMUNICATION OF NEEDS Skin   Total Care Verbally Normal                       Personal Care Assistance Level of Assistance  Bathing, Feeding, Dressing, Total care Bathing Assistance: Maximum assistance Feeding assistance: Maximum assistance Dressing Assistance: Maximum assistance     Functional Limitations Info  Sight, Hearing, Speech Sight Info: Adequate Hearing Info: Adequate Speech Info: Adequate    SPECIAL CARE FACTORS FREQUENCY                       Contractures Contractures Info:  Not present    Additional Factors Info  Code Status, Allergies Code Status Info: DNR Allergies Info: Tape           Current Medications (10/26/2023):  This is the current hospital active medication list Current Facility-Administered Medications  Medication Dose Route Frequency Provider Last Rate Last Admin   acetaminophen  (TYLENOL ) tablet 650 mg  650 mg Oral Q6H PRN Pokhrel, Laxman, MD       Or   acetaminophen  (TYLENOL ) suppository 650 mg  650 mg Rectal Q6H PRN Pokhrel, Laxman, MD       atorvastatin  (LIPITOR) tablet 10 mg  10 mg Oral q1800 Pokhrel, Laxman, MD       bisacodyl  (DULCOLAX) suppository 10 mg  10 mg Rectal QODAY Pokhrel, Laxman, MD       cefTRIAXone  (ROCEPHIN ) 2 g in sodium chloride  0.9 % 100 mL IVPB  2 g Intravenous Q24H Pokhrel, Laxman, MD 200 mL/hr at 10/26/23 0846 2 g at 10/26/23 0846   enoxaparin  (LOVENOX ) injection 40 mg  40 mg Subcutaneous Q24H Pokhrel, Laxman, MD   40 mg at 10/26/23 9071   escitalopram  (LEXAPRO ) tablet 10 mg  10 mg Oral Daily Pokhrel, Laxman, MD   10 mg at 10/26/23 9071   hydrALAZINE  (APRESOLINE ) injection 10 mg  10 mg Intravenous Q6H PRN Pokhrel, Laxman, MD       lactulose  (CHRONULAC ) 10 GM/15ML solution 20 g  20 g Oral Daily Pokhrel, Laxman, MD  20 g at 10/26/23 9071   melatonin tablet 3 mg  3 mg Oral QHS Pokhrel, Laxman, MD       methimazole  (TAPAZOLE ) tablet 5 mg  5 mg Oral Daily Pokhrel, Laxman, MD   5 mg at 10/26/23 9070   multivitamin with minerals tablet 1 tablet  1 tablet Oral Daily Pokhrel, Laxman, MD   1 tablet at 10/26/23 9071   ondansetron  (ZOFRAN ) tablet 4 mg  4 mg Oral Q6H PRN Pokhrel, Laxman, MD       Or   ondansetron  (ZOFRAN ) injection 4 mg  4 mg Intravenous Q6H PRN Pokhrel, Laxman, MD   4 mg at 10/25/23 1415   polyethylene glycol (MIRALAX  / GLYCOLAX ) packet 17 g  17 g Oral Daily Pokhrel, Laxman, MD   17 g at 10/26/23 9070   senna-docusate (Senokot-S) tablet 2 tablet  2 tablet Oral BID Pokhrel, Laxman, MD   2 tablet at 10/26/23 9071    simethicone  (MYLICON) chewable tablet 120 mg  120 mg Oral TID Pokhrel, Laxman, MD   120 mg at 10/26/23 9070   sodium chloride  flush (NS) 0.9 % injection 3 mL  3 mL Intravenous Q12H Pokhrel, Laxman, MD   3 mL at 10/26/23 0929   sodium chloride  flush (NS) 0.9 % injection 3 mL  3 mL Intravenous PRN Pokhrel, Laxman, MD       [START ON 10/28/2023] Vitamin D  (Ergocalciferol ) (DRISDOL ) 1.25 MG (50000 UNIT) capsule 50,000 Units  50,000 Units Oral Q Fri Pokhrel, Laxman, MD         Discharge Medications: Please see discharge summary for a list of discharge medications.  Relevant Imaging Results:  Relevant Lab Results:   Additional Information ss[address][id number] [place], [place]

## 2023-10-26 NOTE — Progress Notes (Signed)
 PT Cancellation Note  Patient Details Name: KIERAN NACHTIGAL MRN: [id number] DOB: 1965-07-24   Cancelled Treatment:    Reason Eval/Treat Not Completed: PT screened, no needs identified, will sign off  Pt from SNF, is total care at baseline, uses Hoyer lift at for transfers   Rexene, PT  Acute Rehab Dept [facility]) [phone]  10/26/2023     [facility] 10/26/2023, 11:21 AM

## 2023-10-26 NOTE — TOC Initial Note (Signed)
 Transition of Care [facility]) - Initial/Assessment Note    Patient Details  Name: Sharon Bruce MRN: [id number] Date of Birth: [birth year]  Transition of [facility]) CM/SW Contact:    Bascom Service, RN Phone Number: 10/26/2023, 2:35 PM  Clinical Narrative:Confirmed from [facility] d/c plan to return. Bedbound/hoyer lift/total care.DNR. [facility].                   Expected Discharge Plan: Long Term Nursing Home Barriers to Discharge: Continued Medical Work up   Patient Goals and CMS Choice Patient states their goals for this hospitalization and ongoing recovery are:: From [facility] L[facility] CMS Medicare.gov Compare Post Acute Care list provided to:: Patient Choice offered to / list presented to : Patient [place] ownership interest in [facility].provided to:: Patient    Expected Discharge Plan and Services   Discharge Planning Services: CM Consult Post Acute Care Choice: Durable Medical Equipment [facility] lift) Living arrangements for the past 2 months:  (LTC)                                      Prior Living Arrangements/Services Living arrangements for the past 2 months:  (LTC) Lives with:: Facility Resident                   Activities of Daily Living   ADL Screening (condition at time of admission) Independently performs ADLs?: No Does the patient have a NEW difficulty with bathing/dressing/toileting/self-feeding that is expected to last >3 days?: No Does the patient have a NEW difficulty with getting in/out of bed, walking, or climbing stairs that is expected to last >3 days?: No Does the patient have a NEW difficulty with communication that is expected to last >3 days?: No Is the patient deaf or have difficulty hearing?: No Does the patient have difficulty seeing, even when wearing glasses/contacts?: Yes Does the patient have difficulty concentrating, remembering, or making decisions?: Yes  Permission Sought/Granted                   Emotional Assessment              Admission diagnosis:  Renal calculus [N20.0] Acute cystitis with hematuria [N30.01] Altered mental status, unspecified altered mental status type [R41.82] UTI (urinary tract infection) [N39.0] Patient Active Problem List   Diagnosis Date Noted   UTI (urinary tract infection) 10/26/2023   Renal calculus 10/25/2023   Hyperthyroidism 10/25/2023   Hypokalemia 10/25/2023   Sepsis secondary to UTI (HCC) 10/25/2023   Renal mass 10/25/2023   Thyroid  mass 10/25/2023   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   Poor vision 11/11/2014   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   Weakness 09/29/2009   PCP:  Yolande Toribio MATSU, MD Pharmacy:   [id number] - [place], [place] - [address] [place] [address] Phone: [phone] Fax: [phone]     Social Drivers of Health (SDOH) Social History: SDOH Screenings   Food Insecurity: No Food Insecurity (10/25/2023)  Housing: Low Risk  (10/25/2023)  Transportation Needs: No Transportation Needs (10/25/2023)  Utilities: Not At Risk (10/25/2023)  Tobacco Use: Unknown (10/25/2023)   SDOH Interventions:     Readmission Risk Interventions     No data to display

## 2023-10-26 NOTE — Progress Notes (Signed)
 1 Day Post-Op Subjective: No acute events overnight.  Patient looks significantly better this morning and is in good spirits.  Briefly reviewed the case and plan.  She had no questions for me.  Objective: Vital signs in last 24 hours: Temp:  [97.7 F (36.5 C)-102.2 F (39 C)] 97.7 F (36.5 C) (08/27 0431) Pulse Rate:  [68-111] 68 (08/27 0431) Resp:  [14-20] 19 (08/27 0431) BP: (128-141)/(73-94) 128/73 (08/27 0431) SpO2:  [95 %-100 %] 96 % (08/27 0431) Weight:  [77.1 kg-78.9 kg] 78.9 kg (08/27 0500)  Assessment/Plan: #Ureteral stones To the OR with Dr. Alvaro for cystoscopy with bilateral retrograde pyelograms and bilateral ureteral stent placement on 10/25/2023.   Definitive stone management on an outpatient basis.   # Chronic UTIs Urinalysis suggestive of urinary tract infection.  Nitrite positive.  Agree with broad ABX.  Tailor to sensitivities WBC mildly elevated.  Preserved renal function. Afebrile.  Okay to discharge from urologic perspective  #renal masses Reviewed with patient last night and again today.  CT A/P on 10/25/2023 notes 3.1 x 2.5 cm mass of unclear significance in anterior midportion of right kidney, as well as a 1.7 x 1.6 cm cystic mass arising from the superior pole of the left kidney.  This smaller mass was identified as consistent with renal cell carcinoma.  She will follow-up closely with Dr. Alvaro to monitor these.   Intake/Output from previous day: 08/26 0701 - 08/27 0700 In: 1799.6 [I.V.:1643.1; IV Piggyback:156.5] Out: 1500 [Urine:1500]  Intake/Output this shift: No intake/output data recorded.  Physical Exam:  General: Alert and oriented CV: No cyanosis Lungs: equal chest rise Abdomen: Soft, NTND, no rebound or guarding  Lab Results: Recent Labs    10/25/23 0738 10/26/23 0522  HGB 10.2* 11.3*  HCT 33.1* 37.9   BMET Recent Labs    10/25/23 0738 10/26/23 0522  NA 141 138  K 2.8* 4.3  CL 112* 104  CO2 20* 21*  GLUCOSE 101* 142*   BUN 15 9  CREATININE 0.36* 0.54  CALCIUM  7.2* 9.2  HGB 10.2* 11.3*  WBC 12.5* 12.2*     Studies/Results: DG C-Arm 1-60 Min-No Report Result Date: 10/25/2023 Fluoroscopy was utilized by the requesting physician.  No radiographic interpretation.   CT Angio Chest PE W/Cm &/Or Wo Cm Result Date: 10/25/2023 CLINICAL DATA:  PE suspected, versus pneumonia, lethargy, abdominal pain * Tracking Code: BO * EXAM: CT ANGIOGRAPHY CHEST CT ABDOMEN AND PELVIS WITH CONTRAST TECHNIQUE: Multidetector CT imaging of the chest was performed using the standard protocol during bolus administration of intravenous contrast. Multiplanar CT image reconstructions and MIPs were obtained to evaluate the vascular anatomy. Multidetector CT imaging of the abdomen and pelvis was performed using the standard protocol during bolus administration of intravenous contrast. RADIATION DOSE REDUCTION: This exam was performed according to the departmental dose-optimization program which includes automated exposure control, adjustment of the mA and/or kV according to patient size and/or use of iterative reconstruction technique. CONTRAST:  OMNIPAQUE  IOHEXOL  350 MG/ML SOLN COMPARISON:  None Available. FINDINGS: CT CHEST ANGIOGRAM FINDINGS Cardiovascular: Satisfactory opacification of the pulmonary arteries to the segmental level. No evidence of pulmonary embolism. Normal heart size. No pericardial effusion. Mediastinum/Nodes: No enlarged mediastinal, hilar, or axillary lymph nodes. Hypodense nodule of the left lobe of the thyroid  measuring 2.6 x 2.5 cm, which deflects the trachea rightward. 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 Esophagus without significant findings. Lungs/Pleura: Trace pleural effusions and bibasilar atelectasis or consolidation. Musculoskeletal: No chest wall abnormality. No acute  osseous findings. Review of the MIP images confirms the above findings. CT ABDOMEN PELVIS FINDINGS Hepatobiliary: No solid  liver abnormality is seen. Multiple fluid attenuation cysts throughout the liver. No gallstones, gallbladder wall thickening, or biliary dilatation. Pancreas: Unremarkable. No pancreatic ductal dilatation or surrounding inflammatory changes. Spleen: Normal in size without significant abnormality. Adrenals/Urinary Tract: Adrenal glands are unremarkable. Hypodense masslike lesion in the anterior midportion of the right kidney measuring 3.1 x 2.5 cm (series 2, image 65). Right-sided perinephric fat stranding. Heterogeneously enhancing, mixed solid and cystic appearing mass arising from the peripheral superior pole of the left kidney measuring 1.7 x 1.6 cm (series 2, image 32). Calculus at the left ureteropelvic junction measuring 1.2 cm without significant hydronephrosis (series 7, image 85). Nonobstructive calculus in the dependent right renal pelvis. Bladder is unremarkable. Stomach/Bowel: Stomach is within normal limits. Appendix appears normal. No evidence of bowel wall thickening, distention, or inflammatory changes. Vascular/Lymphatic: No significant vascular findings are present. No enlarged abdominal or pelvic lymph nodes. Reproductive: No mass or other significant abnormality. Other: No abdominal wall hernia or abnormality. No ascites. Musculoskeletal: No acute or significant osseous findings. IMPRESSION: 1. Negative examination for pulmonary embolism. 2. Trace pleural effusions and bibasilar atelectasis or consolidation. 3. Hypodense masslike lesion in the anterior midportion of the right kidney measuring 3.1 x 2.5 cm. Right-sided perinephric fat stranding. This may reflect focal pyelonephritis or alternately a renal mass. Contrast enhanced MRI may be helpful to further characterize. 4. Heterogeneously enhancing, mixed solid and cystic appearing mass arising from the peripheral superior pole of the left kidney measuring 1.7 x 1.6 cm. This is consistent with a renal cell carcinoma. 5. Calculus at the left  ureteropelvic junction measuring 1.2 cm without significant hydronephrosis. 6. Nonobstructive calculus in the dependent right renal pelvis. 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 8. Hypodense nodule of the left lobe of the thyroid  measuring 2.6 x 2.5 cm, which deflects the trachea rightward. This can be further characterized by thyroid  ultrasound on a nonemergent, outpatient basis if and when clinically appropriate. Electronically Signed   By: Marolyn JONETTA Jaksch M.D.   On: 10/25/2023 11:23   CT ABDOMEN PELVIS W CONTRAST Result Date: 10/25/2023 CLINICAL DATA:  PE suspected, versus pneumonia, lethargy, abdominal pain * Tracking Code: BO * EXAM: CT ANGIOGRAPHY CHEST CT ABDOMEN AND PELVIS WITH CONTRAST TECHNIQUE: Multidetector CT imaging of the chest was performed using the standard protocol during bolus administration of intravenous contrast. Multiplanar CT image reconstructions and MIPs were obtained to evaluate the vascular anatomy. Multidetector CT imaging of the abdomen and pelvis was performed using the standard protocol during bolus administration of intravenous contrast. RADIATION DOSE REDUCTION: This exam was performed according to the departmental dose-optimization program which includes automated exposure control, adjustment of the mA and/or kV according to patient size and/or use of iterative reconstruction technique. CONTRAST:  OMNIPAQUE  IOHEXOL  350 MG/ML SOLN COMPARISON:  None Available. FINDINGS: CT CHEST ANGIOGRAM FINDINGS Cardiovascular: Satisfactory opacification of the pulmonary arteries to the segmental level. No evidence of pulmonary embolism. Normal heart size. No pericardial effusion. Mediastinum/Nodes: No enlarged mediastinal, hilar, or axillary lymph nodes. Hypodense nodule of the left lobe of the thyroid  measuring 2.6 x 2.5 cm, which deflects the trachea rightward. 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 Esophagus without significant findings. Lungs/Pleura: Trace pleural effusions  and bibasilar atelectasis or consolidation. Musculoskeletal: No chest wall abnormality. No acute osseous findings. Review of the MIP images confirms the above findings. CT ABDOMEN PELVIS FINDINGS Hepatobiliary: No solid liver abnormality is seen. Multiple fluid  attenuation cysts throughout the liver. No gallstones, gallbladder wall thickening, or biliary dilatation. Pancreas: Unremarkable. No pancreatic ductal dilatation or surrounding inflammatory changes. Spleen: Normal in size without significant abnormality. Adrenals/Urinary Tract: Adrenal glands are unremarkable. Hypodense masslike lesion in the anterior midportion of the right kidney measuring 3.1 x 2.5 cm (series 2, image 65). Right-sided perinephric fat stranding. Heterogeneously enhancing, mixed solid and cystic appearing mass arising from the peripheral superior pole of the left kidney measuring 1.7 x 1.6 cm (series 2, image 32). Calculus at the left ureteropelvic junction measuring 1.2 cm without significant hydronephrosis (series 7, image 85). Nonobstructive calculus in the dependent right renal pelvis. Bladder is unremarkable. Stomach/Bowel: Stomach is within normal limits. Appendix appears normal. No evidence of bowel wall thickening, distention, or inflammatory changes. Vascular/Lymphatic: No significant vascular findings are present. No enlarged abdominal or pelvic lymph nodes. Reproductive: No mass or other significant abnormality. Other: No abdominal wall hernia or abnormality. No ascites. Musculoskeletal: No acute or significant osseous findings. IMPRESSION: 1. Negative examination for pulmonary embolism. 2. Trace pleural effusions and bibasilar atelectasis or consolidation. 3. Hypodense masslike lesion in the anterior midportion of the right kidney measuring 3.1 x 2.5 cm. Right-sided perinephric fat stranding. This may reflect focal pyelonephritis or alternately a renal mass. Contrast enhanced MRI may be helpful to further characterize. 4.  Heterogeneously enhancing, mixed solid and cystic appearing mass arising from the peripheral superior pole of the left kidney measuring 1.7 x 1.6 cm. This is consistent with a renal cell carcinoma. 5. Calculus at the left ureteropelvic junction measuring 1.2 cm without significant hydronephrosis. 6. Nonobstructive calculus in the dependent right renal pelvis. 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 8. Hypodense nodule of the left lobe of the thyroid  measuring 2.6 x 2.5 cm, which deflects the trachea rightward. This can be further characterized by thyroid  ultrasound on a nonemergent, outpatient basis if and when clinically appropriate. Electronically Signed   By: Marolyn JONETTA Jaksch M.D.   On: 10/25/2023 11:23   DG Chest 2 View Result Date: 10/25/2023 CLINICAL DATA:  Possible sepsis. EXAM: CHEST - 2 VIEW COMPARISON:  None Available. FINDINGS: The heart size and mediastinal contours are within normal limits. Both lungs are clear. The visualized skeletal structures are unremarkable. IMPRESSION: No active cardiopulmonary disease. Electronically Signed   By: Lynwood Landy Raddle M.D.   On: 10/25/2023 07:55      LOS: 0 days   Ole Bourdon, NP [facility] Pager: [phone]  10/26/2023, 2:03 PM

## 2023-10-27 ENCOUNTER — Inpatient Hospital Stay (HOSPITAL_COMMUNITY)

## 2023-10-27 ENCOUNTER — Other Ambulatory Visit: Payer: Self-pay | Admitting: Pharmacist

## 2023-10-27 ENCOUNTER — Other Ambulatory Visit: Payer: Self-pay

## 2023-10-27 DIAGNOSIS — B9620 Unspecified Escherichia coli [E. coli] as the cause of diseases classified elsewhere: Secondary | ICD-10-CM

## 2023-10-27 DIAGNOSIS — A419 Sepsis, unspecified organism: Secondary | ICD-10-CM | POA: Diagnosis not present

## 2023-10-27 DIAGNOSIS — N390 Urinary tract infection, site not specified: Secondary | ICD-10-CM | POA: Diagnosis not present

## 2023-10-27 DIAGNOSIS — Z1612 Extended spectrum beta lactamase (ESBL) resistance: Secondary | ICD-10-CM

## 2023-10-27 LAB — BASIC METABOLIC PANEL WITH GFR
Anion gap: 13 (ref 5–15)
BUN: 15 mg/dL (ref 6–20)
CO2: 24 mmol/L (ref 22–32)
Calcium: 9.5 mg/dL (ref 8.9–10.3)
Chloride: 103 mmol/L (ref 98–111)
Creatinine, Ser: 0.64 mg/dL (ref 0.44–1.00)
GFR, Estimated: >60 mL/min (ref >60)
Glucose, Bld: 103 mg/dL — ABNORMAL HIGH (ref 70–99)
Potassium: 3.9 mmol/L (ref 3.5–5.1)
Sodium: 140 mmol/L (ref 135–145)

## 2023-10-27 LAB — CBC
HCT: 38.2 % (ref 36.0–46.0)
Hemoglobin: 11.4 g/dL — ABNORMAL LOW (ref 12.0–15.0)
MCH: 27.8 pg (ref 26.0–34.0)
MCHC: 29.8 g/dL — ABNORMAL LOW (ref 30.0–36.0)
MCV: 93.2 fL (ref 80.0–100.0)
Platelets: 353 K/uL (ref 150–400)
RBC: 4.1 MIL/uL (ref 3.87–5.11)
RDW: 15 % (ref 11.5–15.5)
WBC: 15.7 K/uL — ABNORMAL HIGH (ref 4.0–10.5)
nRBC: 0 % (ref 0.0–0.2)

## 2023-10-27 LAB — URINE CULTURE: Culture: >=100000 — AB

## 2023-10-27 LAB — MAGNESIUM: Magnesium: 2.2 mg/dL (ref 1.7–2.4)

## 2023-10-27 MED ORDER — SODIUM CHLORIDE 0.9% FLUSH: 10.0000 mL | INTRAVENOUS | Status: DC | PRN

## 2023-10-27 MED ORDER — SODIUM CHLORIDE 0.9 % IV SOLN
1.0000 g | Freq: Three times a day (TID) | INTRAVENOUS | Status: DC
  Administered 2023-10-27 – 2023-10-28 (×3): 1 g via INTRAVENOUS
  Filled 2023-10-27 (×4): qty 20

## 2023-10-27 MED ORDER — LOSARTAN POTASSIUM 50 MG PO TABS
25.0000 mg | ORAL_TABLET | Freq: Every day | ORAL | Status: DC
  Administered 2023-10-27 – 2023-10-28 (×2): 25 mg via ORAL
  Filled 2023-10-27 (×2): qty 1

## 2023-10-27 MED ORDER — SODIUM CHLORIDE 0.9% FLUSH
10.0000 mL | Freq: Two times a day (BID) | INTRAVENOUS | Status: DC
  Administered 2023-10-27: 10 mL

## 2023-10-27 NOTE — Progress Notes (Signed)
   10/27/23 1338  Vitals  Temp 97.9 F (36.6 C)  BP (!) 147/92  MAP (mmHg) 103  BP Location Right Arm  BP Method Automatic  Patient Position (if appropriate) Lying  Resp 18  Level of Consciousness  Level of Consciousness Alert  Oxygen Therapy  SpO2 91 %  O2 Device Room Air

## 2023-10-27 NOTE — Progress Notes (Signed)
 Peripherally Inserted Central Catheter Placement  The IV Nurse has discussed with the patient and/or persons authorized to consent for the patient, the purpose of this procedure and the potential benefits and risks involved with this procedure.  The benefits include less needle sticks, lab draws from the catheter, and the patient may be discharged home with the catheter. Risks include, but not limited to, infection, bleeding, blood clot (thrombus formation), and puncture of an artery; nerve damage and irregular heartbeat and possibility to perform a PICC exchange if needed/ordered by physician.  Alternatives to this procedure were also discussed.  Bard Power PICC patient education guide, fact sheet on infection prevention and patient information card has been provided to patient /or left at bedside.  PICC consent obtained from sister at bedside due to altered mental status.  PICC inserted by Glenys Sable, RN    PICC Placement Documentation  PICC Single Lumen 10/27/23 Right Basilic 34 cm 0 cm (Active)  Indication for Insertion or Continuance of Line Prolonged intravenous therapies 10/27/23 1803  Exposed Catheter (cm) 0 cm 10/27/23 1803  Site Assessment Clean, Dry, Intact 10/27/23 1803  Line Status Flushed;Saline locked;Blood return noted 10/27/23 1803  Dressing Type Transparent;Securing device 10/27/23 1803  Dressing Status Antimicrobial disc/dressing in place;Clean, Dry, Intact 10/27/23 1803  Line Care Connections checked and tightened 10/27/23 1803  Line Adjustment (NICU/IV Team Only) No 10/27/23 1803  Dressing Intervention Adhesive placed at insertion site (IV team only);New dressing 10/27/23 1803  Dressing Change Due 11/03/23 10/27/23 1803       Sharon Bruce, [name] 10/27/2023, 6:04 PM

## 2023-10-27 NOTE — Anesthesia Postprocedure Evaluation (Signed)
 Anesthesia Post Note  Patient: Sharon Bruce  Procedure(s) Performed: CYSTOSCOPY, WITH RETROGRADE PYELOGRAM AND URETERAL STENT INSERTION (Bilateral: Ureter)     Patient location during evaluation: PACU Anesthesia Type: General Level of consciousness: awake and alert Pain management: pain level controlled Vital Signs Assessment: post-procedure vital signs reviewed and stable Respiratory status: spontaneous breathing, nonlabored ventilation and respiratory function stable Cardiovascular status: blood pressure returned to baseline Postop Assessment: no apparent nausea or vomiting Anesthetic complications: no   No notable events documented.            Vertell Row

## 2023-10-27 NOTE — Plan of Care (Signed)

## 2023-10-27 NOTE — Progress Notes (Signed)
 PROGRESS NOTE  Sharon Bruce FMW:[id number] DOB: 13-Oct-1965 DOA: 10/25/2023 PCP: Yolande Toribio MATSU, MD   LOS: 1 day   Brief narrative:  Patient is a 58 years old female with past medical history of spinal muscular atrophy, hypertension bedbound status from assisted living facility with past medical history presented to the hospital with lethargy and generalized weakness..In the ED, patient was noted to be febrile with a temperature of 100.9 F and was tachycardic and slightly tachypneic. WBC elevation at 12.5 with neutrophilia.  BMP showed significant hypokalemia with potassium of 2.8 urinalysis showed nitrite positive more than 50 RBC and white cells.  Lactate of 0.6.   CT scan of the abdomen and pelvis showed hypodense masslike lesion in the right kidney, cystic and solid mass in the superior pole of the left kidney consistent with renal cell carcinoma, calculus of the left ureteropelvic junction measuring around 1.2 cm without significant hydronephrosis and calculus in the right renal pelvis.  Urology was consulted, blood cultures and urine cultures were sent from the ED and patient was considered for admission to the hospital for further evaluation and treatment.    Assessment/Plan: Principal Problem:   Sepsis secondary to UTI [facility]) Active Problems:   Charcot Marie Tooth muscular atrophy   Spinocerebellar ataxia (HCC)   Poor vision   Renal calculus   Hyperthyroidism   Hypokalemia   Renal mass   Thyroid  mass   UTI (urinary tract infection)  Sepsis secondary to UTI secondary to left ureteropelvic junction calculus   Imaging without significant hydronephrosis.  Seen by urology and has undergone bilateral ureteric stent placement on 10/26/2023.  Blood cultures negative in less than 24 hours but urine cultures showing ESBL E. coli.  On IV Rocephin .  Will change to meropenem .  Will discuss with ID regarding further treatment plan in the context of complicated UTI stones and stents.    Significant hypokalemia.  Improved after replacement.  Latest potassium of 3.9  Ureteric stones history of  recurrent UTI Seen by urology and is status post ureteric stent bilaterally.   blood cultures negative so far.  Change antibiotic to meropenem  from today.  Urine culture showing ESBL E. coli  Cystic and solid mass in the left kidney.  Could not rule out renal cell carcinoma.  Urology on board.  Will need follow-up with urology.    Right renal mass.  Will need follow-up with urology.  Hyperlipidemia.  On statin at home.  Will hold off for now.  Hypertension.  On losartan .  Will resume.  Hyperthyroidism.  On methimazole .  TSH of 0.8 at this time.  Continue.  Incidental thyroid  nodule noted on the CT scan of the chest.  Will need outpatient follow-up.  On methimazole  for hyperthyroidism  History of bedbound status from muscular atrophy. From assisted living facility continue supportive care.  Patient is total care from skilled nursing facility.  DVT prophylaxis: enoxaparin  (LOVENOX ) injection 40 mg Start: 10/26/23 1000   Disposition: Skilled nursing facility in 1 to 2 days.  Status is: Inpatient Remains inpatient appropriate because: Pending clinical improvement, pending cultures    Code Status:     Code Status: Limited: Do not attempt resuscitation (DNR) -DNR-LIMITED -Do Not Intubate/DNI   Family Communication: Spoke with the patient's sister at bedside on 10/26/2018  Consultants: Urology ID consultation/opinion  Procedures: Cystoscopy with bilateral ureteric stent placement on 10/25/2023  Anti-infectives:  Rocephin  IV, will change to meropenem   Anti-infectives (From admission, onward)    Start  Dose/Rate Route Frequency Ordered Stop   10/26/23 0800  cefTRIAXone  (ROCEPHIN ) 2 g in sodium chloride  0.9 % 100 mL IVPB        2 g 200 mL/hr over 30 Minutes Intravenous Every 24 hours 10/25/23 1342     10/25/23 0715  cefTRIAXone  (ROCEPHIN ) 2 g in sodium chloride  0.9  % 100 mL IVPB        2 g 200 mL/hr over 30 Minutes Intravenous Once 10/25/23 0705 10/25/23 0819   10/25/23 0715  azithromycin  (ZITHROMAX ) 500 mg in sodium chloride  0.9 % 250 mL IVPB        500 mg 250 mL/hr over 60 Minutes Intravenous  Once 10/25/23 0705 10/25/23 0853        Subjective: Today, patient was seen and examined at bedside.  He appears to be more alert awake and Communicative.  Denies any pain nausea vomiting fever chills or rigor.  Objective: Vitals:   10/26/23 1940 10/27/23 0532  BP: (!) 142/67 (!) 147/82  Pulse: 94 93  Resp: 15 18  Temp: 97.9 F (36.6 C) 97.9 F (36.6 C)  SpO2: 91% 95%    Intake/Output Summary (Last 24 hours) at 10/27/2023 0914 Last data filed at 10/27/2023 0625 Gross per 24 hour  Intake --  Output 1800 ml  Net -1800 ml   Filed Weights   10/25/23 1748 10/26/23 0500 10/27/23 0532  Weight: 77.1 kg 78.9 kg 79.9 kg   Body mass index is 27.6 kg/m.   Physical Exam:  GENERAL: Patient is alert awake and Communicative, more distinctive speech today not in obvious distress. HENT: No scleral pallor or icterus. Pupils equally reactive to light. Oral mucosa is moist NECK: is supple, no gross swelling noted. CHEST: Decreased breath sounds bilaterally.  Crackles or wheezing. CVS: S1 and S2 heard, no murmur. Regular rate and rhythm.  ABDOMEN: Soft, non-tender, bowel sounds are present.  No costovertebral angle tenderness EXTREMITIES: No edema. CNS: Muscular atrophy noted.  Weakness of the upper extremities.  Unable to move lower extremities. SKIN: warm and dry without rashes.  Data Review: I have personally reviewed the following laboratory data and studies,  CBC: Recent Labs  Lab 10/25/23 0738 10/26/23 0522 10/27/23 0539  WBC 12.5* 12.2* 15.7*  NEUTROABS 10.9*  --   --   HGB 10.2* 11.3* 11.4*  HCT 33.1* 37.9 38.2  MCV 94.0 92.9 93.2  PLT 248 288 353   Basic Metabolic Panel: Recent Labs  Lab 10/25/23 0738 10/26/23 0522 10/27/23 0539   NA 141 138 140  K 2.8* 4.3 3.9  CL 112* 104 103  CO2 20* 21* 24  GLUCOSE 101* 142* 103*  BUN 15 9 15   CREATININE 0.36* 0.54 0.64  CALCIUM  7.2* 9.2 9.5  MG  --  1.9 2.2  PHOS  --  2.5  --    Liver Function Tests: Recent Labs  Lab 10/25/23 0738  AST 11*  ALT 9  ALKPHOS 62  BILITOT 0.8  PROT 4.6*  ALBUMIN 2.5*   No results for input(s): LIPASE, AMYLASE in the last 168 hours. No results for input(s): AMMONIA in the last 168 hours. Cardiac Enzymes: No results for input(s): CKTOTAL, CKMB, CKMBINDEX, TROPONINI in the last 168 hours. BNP (last 3 results) No results for input(s): BNP in the last 8760 hours.  ProBNP (last 3 results) No results for input(s): PROBNP in the last 8760 hours.  CBG: No results for input(s): GLUCAP in the last 168 hours. Recent Results (from the past 240 hours)  Blood Culture (routine x 2)     Status: None (Preliminary result)   Collection Time: 10/25/23  7:38 AM   Specimen: BLOOD  Result Value Ref Range Status   Specimen Description   Final    BLOOD BLOOD RIGHT ARM Performed at [facility], [address] [place], [place] [address]    Special Requests   Final    BOTTLES DRAWN AEROBIC AND ANAEROBIC Blood Culture adequate volume Performed at [facility], [address] [place], [place] [address]    Culture   Final    NO GROWTH < 24 HOURS Performed at [facility], [address], [place], [place] [address]    Report Status PENDING  Incomplete  Urine Culture     Status: Abnormal   Collection Time: 10/25/23  7:38 AM   Specimen: Urine, Random  Result Value Ref Range Status   Specimen Description   Final    URINE, RANDOM Performed at [facility], [address], [place], [place] [address]    Special Requests   Final    NONE Reflexed from [phone] Performed at [facility], [address], [place], [place] [address]    Culture (A)  Final     >=100,000 COLONIES/mL ESCHERICHIA COLI Confirmed Extended Spectrum Beta-Lactamase Producer (ESBL).  In bloodstream infections from ESBL organisms, carbapenems are preferred over piperacillin/tazobactam. They are shown to have a lower risk of mortality.    Report Status 10/27/2023 FINAL  Final   Organism ID, Bacteria ESCHERICHIA COLI (A)  Final      Susceptibility   Escherichia coli - MIC*    AMPICILLIN >=32 RESISTANT Resistant     CEFAZOLIN (URINE) Value in next row Resistant      >=32 RESISTANTThis is a modified FDA-approved test that has been validated and its performance characteristics determined by the reporting laboratory.  This laboratory is certified under the Clinical Laboratory Improvement Amendments CLIA as qualified to perform high complexity clinical laboratory testing.    CEFEPIME Value in next row Resistant      >=32 RESISTANTThis is a modified FDA-approved test that has been validated and its performance characteristics determined by the reporting laboratory.  This laboratory is certified under the Clinical Laboratory Improvement Amendments CLIA as qualified to perform high complexity clinical laboratory testing.    ERTAPENEM  Value in next row Sensitive      >=32 RESISTANTThis is a modified FDA-approved test that has been validated and its performance characteristics determined by the reporting laboratory.  This laboratory is certified under the Clinical Laboratory Improvement Amendments CLIA as qualified to perform high complexity clinical laboratory testing.    CEFTRIAXONE  Value in next row Resistant      >=32 RESISTANTThis is a modified FDA-approved test that has been validated and its performance characteristics determined by the reporting laboratory.  This laboratory is certified under the Clinical Laboratory Improvement Amendments CLIA as qualified to perform high complexity clinical laboratory testing.    CIPROFLOXACIN Value in next row Resistant      >=32 RESISTANTThis is  a modified FDA-approved test that has been validated and its performance characteristics determined by the reporting laboratory.  This laboratory is certified under the Clinical Laboratory Improvement Amendments CLIA as qualified to perform high complexity clinical laboratory testing.    GENTAMICIN Value in next row Sensitive      >=32 RESISTANTThis is a modified FDA-approved test that has been validated and its performance characteristics determined by the reporting  laboratory.  This laboratory is certified under the Clinical Laboratory Improvement Amendments CLIA as qualified to perform high complexity clinical laboratory testing.    NITROFURANTOIN Value in next row Sensitive      >=32 RESISTANTThis is a modified FDA-approved test that has been validated and its performance characteristics determined by the reporting laboratory.  This laboratory is certified under the Clinical Laboratory Improvement Amendments CLIA as qualified to perform high complexity clinical laboratory testing.    TRIMETH/SULFA Value in next row Resistant      >=32 RESISTANTThis is a modified FDA-approved test that has been validated and its performance characteristics determined by the reporting laboratory.  This laboratory is certified under the Clinical Laboratory Improvement Amendments CLIA as qualified to perform high complexity clinical laboratory testing.    AMPICILLIN/SULBACTAM Value in next row Resistant      >=32 RESISTANTThis is a modified FDA-approved test that has been validated and its performance characteristics determined by the reporting laboratory.  This laboratory is certified under the Clinical Laboratory Improvement Amendments CLIA as qualified to perform high complexity clinical laboratory testing.    PIP/TAZO Value in next row Sensitive ug/mL     <=4 SENSITIVEThis is a modified FDA-approved test that has been validated and its performance characteristics determined by the reporting laboratory.  This laboratory  is certified under the Clinical Laboratory Improvement Amendments CLIA as qualified to perform high complexity clinical laboratory testing.    MEROPENEM  Value in next row Sensitive      <=4 SENSITIVEThis is a modified FDA-approved test that has been validated and its performance characteristics determined by the reporting laboratory.  This laboratory is certified under the Clinical Laboratory Improvement Amendments CLIA as qualified to perform high complexity clinical laboratory testing.    * >=100,000 COLONIES/mL ESCHERICHIA COLI  Resp panel by RT-PCR (RSV, Flu A&B, Covid) Anterior Nasal Swab     Status: None   Collection Time: 10/25/23  7:47 AM   Specimen: Anterior Nasal Swab  Result Value Ref Range Status   SARS Coronavirus 2 by RT PCR NEGATIVE NEGATIVE Final    Comment: (NOTE) SARS-CoV-2 target nucleic acids are NOT DETECTED.  The SARS-CoV-2 RNA is generally detectable in upper respiratory specimens during the acute phase of infection. The lowest concentration of SARS-CoV-2 viral copies this assay can detect is 138 copies/mL. A negative result does not preclude SARS-Cov-2 infection and should not be used as the sole basis for treatment or other patient management decisions. A negative result may occur with  improper specimen collection/handling, submission of specimen other than nasopharyngeal swab, presence of viral mutation(s) within the areas targeted by this assay, and inadequate number of viral copies(<138 copies/mL). A negative result must be combined with clinical observations, patient history, and epidemiological information. The expected result is Negative.  Fact Sheet for Patients:  BloggerCourse.com  Fact Sheet for Healthcare Providers:  SeriousBroker.it  This test is no t yet approved or cleared by the [place]  FDA and  has been authorized for detection and/or diagnosis of SARS-CoV-2 by FDA under an Emergency Use  Authorization (EUA). This EUA will remain  in effect (meaning this test can be used) for the duration of the COVID-19 declaration under Section 564(b)(1) of the Act, 21 U.S.C.section 360bbb-3(b)(1), unless the authorization is terminated  or revoked sooner.       Influenza A by PCR NEGATIVE NEGATIVE Final   Influenza B by PCR NEGATIVE NEGATIVE Final    Comment: (NOTE) The Xpert Xpress SARS-CoV-2/FLU/RSV plus assay is intended as  an aid in the diagnosis of influenza from Nasopharyngeal swab specimens and should not be used as a sole basis for treatment. Nasal washings and aspirates are unacceptable for Xpert Xpress SARS-CoV-2/FLU/RSV testing.  Fact Sheet for Patients: BloggerCourse.com  Fact Sheet for Healthcare Providers: SeriousBroker.it  This test is not yet approved or cleared by the [place]  FDA and has been authorized for detection and/or diagnosis of SARS-CoV-2 by FDA under an Emergency Use Authorization (EUA). This EUA will remain in effect (meaning this test can be used) for the duration of the COVID-19 declaration under Section 564(b)(1) of the Act, 21 U.S.C. section 360bbb-3(b)(1), unless the authorization is terminated or revoked.     Resp Syncytial Virus by PCR NEGATIVE NEGATIVE Final    Comment: (NOTE) Fact Sheet for Patients: BloggerCourse.com  Fact Sheet for Healthcare Providers: SeriousBroker.it  This test is not yet approved or cleared by the [place]  FDA and has been authorized for detection and/or diagnosis of SARS-CoV-2 by FDA under an Emergency Use Authorization (EUA). This EUA will remain in effect (meaning this test can be used) for the duration of the COVID-19 declaration under Section 564(b)(1) of the Act, 21 U.S.C. section 360bbb-3(b)(1), unless the authorization is terminated or revoked.  Performed at [facility],  [address] [place], [place] [address]   Blood Culture (routine x 2)     Status: None (Preliminary result)   Collection Time: 10/25/23  7:48 AM   Specimen: BLOOD  Result Value Ref Range Status   Specimen Description   Final    BLOOD BLOOD LEFT FOREARM Performed at [facility], [address], [place], [place] [address]    Special Requests   Final    BOTTLES DRAWN AEROBIC AND ANAEROBIC Blood Culture adequate volume Performed at [facility], [address] [place], [place] [address]    Culture   Final    NO GROWTH < 24 HOURS Performed at [facility], [address], [place], [place] [address]    Report Status PENDING  Incomplete  MRSA Next Gen by PCR, Nasal     Status: None   Collection Time: 10/25/23  3:34 PM   Specimen: Nasal Mucosa; Nasal Swab  Result Value Ref Range Status   MRSA by PCR Next Gen NOT DETECTED NOT DETECTED Final    Comment: (NOTE) The GeneXpert MRSA Assay (FDA approved for NASAL specimens only), is one component of a comprehensive MRSA colonization surveillance program. It is not intended to diagnose MRSA infection nor to guide or monitor treatment for MRSA infections. Test performance is not FDA approved in patients less than 64 years old. Performed at [facility], [address] [place], [place] [address]      Studies: DG C-Arm 1-60 Min-No Report Result Date: 10/25/2023 Fluoroscopy was utilized by the requesting physician.  No radiographic interpretation.   CT Angio Chest PE W/Cm &/Or Wo Cm Result Date: 10/25/2023 CLINICAL DATA:  PE suspected, versus pneumonia, lethargy, abdominal pain * Tracking Code: BO * EXAM: CT ANGIOGRAPHY CHEST CT ABDOMEN AND PELVIS WITH CONTRAST TECHNIQUE: Multidetector CT imaging of the chest was performed using the standard protocol during bolus administration of intravenous contrast. Multiplanar CT image reconstructions and MIPs were obtained to evaluate the  vascular anatomy. Multidetector CT imaging of the abdomen and pelvis was performed using the standard protocol during bolus administration of intravenous contrast. RADIATION DOSE REDUCTION: This exam was performed according to the departmental dose-optimization program which includes  automated exposure control, adjustment of the mA and/or kV according to patient size and/or use of iterative reconstruction technique. CONTRAST:  OMNIPAQUE  IOHEXOL  350 MG/ML SOLN COMPARISON:  None Available. FINDINGS: CT CHEST ANGIOGRAM FINDINGS Cardiovascular: Satisfactory opacification of the pulmonary arteries to the segmental level. No evidence of pulmonary embolism. Normal heart size. No pericardial effusion. Mediastinum/Nodes: No enlarged mediastinal, hilar, or axillary lymph nodes. Hypodense nodule of the left lobe of the thyroid  measuring 2.6 x 2.5 cm, which deflects the trachea rightward. 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 Esophagus without significant findings. Lungs/Pleura: Trace pleural effusions and bibasilar atelectasis or consolidation. Musculoskeletal: No chest wall abnormality. No acute osseous findings. Review of the MIP images confirms the above findings. CT ABDOMEN PELVIS FINDINGS Hepatobiliary: No solid liver abnormality is seen. Multiple fluid attenuation cysts throughout the liver. No gallstones, gallbladder wall thickening, or biliary dilatation. Pancreas: Unremarkable. No pancreatic ductal dilatation or surrounding inflammatory changes. Spleen: Normal in size without significant abnormality. Adrenals/Urinary Tract: Adrenal glands are unremarkable. Hypodense masslike lesion in the anterior midportion of the right kidney measuring 3.1 x 2.5 cm (series 2, image 65). Right-sided perinephric fat stranding. Heterogeneously enhancing, mixed solid and cystic appearing mass arising from the peripheral superior pole of the left kidney measuring 1.7 x 1.6 cm (series 2, image 32). Calculus at the  left ureteropelvic junction measuring 1.2 cm without significant hydronephrosis (series 7, image 85). Nonobstructive calculus in the dependent right renal pelvis. Bladder is unremarkable. Stomach/Bowel: Stomach is within normal limits. Appendix appears normal. No evidence of bowel wall thickening, distention, or inflammatory changes. Vascular/Lymphatic: No significant vascular findings are present. No enlarged abdominal or pelvic lymph nodes. Reproductive: No mass or other significant abnormality. Other: No abdominal wall hernia or abnormality. No ascites. Musculoskeletal: No acute or significant osseous findings. IMPRESSION: 1. Negative examination for pulmonary embolism. 2. Trace pleural effusions and bibasilar atelectasis or consolidation. 3. Hypodense masslike lesion in the anterior midportion of the right kidney measuring 3.1 x 2.5 cm. Right-sided perinephric fat stranding. This may reflect focal pyelonephritis or alternately a renal mass. Contrast enhanced MRI may be helpful to further characterize. 4. Heterogeneously enhancing, mixed solid and cystic appearing mass arising from the peripheral superior pole of the left kidney measuring 1.7 x 1.6 cm. This is consistent with a renal cell carcinoma. 5. Calculus at the left ureteropelvic junction measuring 1.2 cm without significant hydronephrosis. 6. Nonobstructive calculus in the dependent right renal pelvis. 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 8. Hypodense nodule of the left lobe of the thyroid  measuring 2.6 x 2.5 cm, which deflects the trachea rightward. This can be further characterized by thyroid  ultrasound on a nonemergent, outpatient basis if and when clinically appropriate. Electronically Signed   By: Marolyn JONETTA Jaksch M.D.   On: 10/25/2023 11:23   CT ABDOMEN PELVIS W CONTRAST Result Date: 10/25/2023 CLINICAL DATA:  PE suspected, versus pneumonia, lethargy, abdominal pain * Tracking Code: BO * EXAM: CT ANGIOGRAPHY CHEST CT ABDOMEN AND PELVIS WITH CONTRAST  TECHNIQUE: Multidetector CT imaging of the chest was performed using the standard protocol during bolus administration of intravenous contrast. Multiplanar CT image reconstructions and MIPs were obtained to evaluate the vascular anatomy. Multidetector CT imaging of the abdomen and pelvis was performed using the standard protocol during bolus administration of intravenous contrast. RADIATION DOSE REDUCTION: This exam was performed according to the departmental dose-optimization program which includes automated exposure control, adjustment of the mA and/or kV according to patient size and/or use of iterative reconstruction technique. CONTRAST:  OMNIPAQUE  IOHEXOL  350  MG/ML SOLN COMPARISON:  None Available. FINDINGS: CT CHEST ANGIOGRAM FINDINGS Cardiovascular: Satisfactory opacification of the pulmonary arteries to the segmental level. No evidence of pulmonary embolism. Normal heart size. No pericardial effusion. Mediastinum/Nodes: No enlarged mediastinal, hilar, or axillary lymph nodes. Hypodense nodule of the left lobe of the thyroid  measuring 2.6 x 2.5 cm, which deflects the trachea rightward. 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 Esophagus without significant findings. Lungs/Pleura: Trace pleural effusions and bibasilar atelectasis or consolidation. Musculoskeletal: No chest wall abnormality. No acute osseous findings. Review of the MIP images confirms the above findings. CT ABDOMEN PELVIS FINDINGS Hepatobiliary: No solid liver abnormality is seen. Multiple fluid attenuation cysts throughout the liver. No gallstones, gallbladder wall thickening, or biliary dilatation. Pancreas: Unremarkable. No pancreatic ductal dilatation or surrounding inflammatory changes. Spleen: Normal in size without significant abnormality. Adrenals/Urinary Tract: Adrenal glands are unremarkable. Hypodense masslike lesion in the anterior midportion of the right kidney measuring 3.1 x 2.5 cm (series 2, image 65).  Right-sided perinephric fat stranding. Heterogeneously enhancing, mixed solid and cystic appearing mass arising from the peripheral superior pole of the left kidney measuring 1.7 x 1.6 cm (series 2, image 32). Calculus at the left ureteropelvic junction measuring 1.2 cm without significant hydronephrosis (series 7, image 85). Nonobstructive calculus in the dependent right renal pelvis. Bladder is unremarkable. Stomach/Bowel: Stomach is within normal limits. Appendix appears normal. No evidence of bowel wall thickening, distention, or inflammatory changes. Vascular/Lymphatic: No significant vascular findings are present. No enlarged abdominal or pelvic lymph nodes. Reproductive: No mass or other significant abnormality. Other: No abdominal wall hernia or abnormality. No ascites. Musculoskeletal: No acute or significant osseous findings. IMPRESSION: 1. Negative examination for pulmonary embolism. 2. Trace pleural effusions and bibasilar atelectasis or consolidation. 3. Hypodense masslike lesion in the anterior midportion of the right kidney measuring 3.1 x 2.5 cm. Right-sided perinephric fat stranding. This may reflect focal pyelonephritis or alternately a renal mass. Contrast enhanced MRI may be helpful to further characterize. 4. Heterogeneously enhancing, mixed solid and cystic appearing mass arising from the peripheral superior pole of the left kidney measuring 1.7 x 1.6 cm. This is consistent with a renal cell carcinoma. 5. Calculus at the left ureteropelvic junction measuring 1.2 cm without significant hydronephrosis. 6. Nonobstructive calculus in the dependent right renal pelvis. 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 8. Hypodense nodule of the left lobe of the thyroid  measuring 2.6 x 2.5 cm, which deflects the trachea rightward. This can be further characterized by thyroid  ultrasound on a nonemergent, outpatient basis if and when clinically appropriate. Electronically Signed   By: Marolyn JONETTA Jaksch M.D.   On:  10/25/2023 11:23      Vernal Alstrom, MD  [facility] 10/27/2023  If 7PM-7AM, please contact night-coverage

## 2023-10-27 NOTE — Consult Note (Signed)
 [facility]    Date of Admission:  10/25/2023   Total days of inpatient antibiotics 2        Reason for Consult: UTI    Principal Problem:   Sepsis secondary to UTI [facility]) Active Problems:   Charcot Marie Tooth muscular atrophy   Spinocerebellar ataxia (HCC)   Poor vision   Renal calculus   Hyperthyroidism   Hypokalemia   Renal mass   Thyroid  mass   UTI (urinary tract infection)   Assessment: 58 year old female past medical history of spinal muscular atrophy, hypertension, hyperlipidemia, bedbound, hypothyroidism admitted with #Complicated UTI in the setting of nephrolithiasis - Patient had 900.9, WBC 12.  On arrival.  Urine cultures grew ESBL E. coli - Initial CT abdomen pelvis has shown hypodense masslike lesion in the anterior midportion of the right kidney measuring 3X 2.5 cm, right-sided perinephric stranding, may reflect focal pyelonephritis or renal mass.  Cystic appearing mass in the superior pole of the left kidney measuring 1.7X 1.6 cm consistent with renal cell carcinoma, calculus in the UPJ without significant hydronephrosis, nonobstructive calculus of right pelvis.,  Left lower thyroid  nodule, recommend outpatient ultrasound of thyroid . - Patient underwent bilateral stent placement in the OR with urology on 8/27.  Per micropigmentation primary plan to follow-up outpatient with urology given masslike lesion with concern for renal cell carcinoma.  Recommendations:  -Continue merrem , transition to ertapenem  outpatient to complete 2 weeks antibiotics from the OR - Place PICC - Follow-up with urology outpatient given renal lesions - Agree with outpatient ultrasound with primary care provider -Contact precautions - Communicated plan with primary  OPAT ORDERS:  Diagnosis: ESBL Ecoli UTI  Allergies  Allergen Reactions   Tape Other (See Comments)    Kinesiology Tape  Rock Tape      Discharge antibiotics to be given via PICC  line:  Per pharmacy protocol Ertapenem  1g IV q24h   Duration: 2 weeks End Date: 11/10/23  [facility] Care Per Protocol with [employer] Use: Home health RN for IV administration and teaching, line care and labs.    Labs weekly while on IV antibiotics: x__ CBC with differential __ BMP **TWICE WEEKLY ON VANCOMYCIN  _x_ CMP __ CRP __ ESR __ Vancomycin trough TWICE WEEKLY __ CK  __ Please pull PIC at completion of IV antibiotics __ Please leave PIC in place until doctor has seen patient or been notified  Fax weekly labs to [phone]  Clinic Follow Up Appt: 9/24  @ [facility] with Dr. Dennise  Microbiology:   Antibiotics: Ceftriaxone  8/26-8/28 Meropenem  8/28/present Azithromycin  8/26  Cultures: Blood 8/26 ng Urine 8/26 ESBL Ecoli Other   HPI: Sharon Bruce is a 58 y.o. female with past medical history of muscular atrophy, hypertension, bedbound, hyperlipidemia, hypothyroidism presented to the hospital with her generalized weakness for about a day.  Patient resides at a skilled facility.  In the ED she had a temp 100.9.  WBC 12.5 K.  CT a chest did not show PE CT abdomen pelvis showed hypodense masslike lesion in the anterior midportion of the right kidney measuring 3X 2.5 cm, right-sided perinephric stranding, may reflect focal pyelonephritis or renal mass.  Cystic appearing mass in the superior pole of the left kidney measuring 1.7X 1.6 cm consistent with renal cell carcinoma, calculus in the UPJ without significant hydronephrosis, nonobstructive calculus of right pelvis.,  Left lower thyroid  nodule, recommend outpatient ultrasound of thyroid . Urology was consulted and patient underwent cystoscopy  with bilateral ureteral stents placement on 8/26 with Dr. Alvaro. Urince + esbl ecoli.   Review of Systems: Review of Systems  All other systems reviewed and are negative.   Past Medical History:  Diagnosis Date   Charcot Marie Tooth muscular atrophy    Hyperlipidemia     Hypertension    Hyperthyroidism     Social History   Tobacco Use   Smoking status: Never  Vaping Use   Vaping status: Never Used  Substance Use Topics   Alcohol use: No    Alcohol/week: 0.0 standard drinks of alcohol   Drug use: No    Family History  Problem Relation Age of Onset   Hypertension Father    Heart disease Father    Hypertension Brother    Charcot-Marie-Tooth disease Brother    Scheduled Meds:  atorvastatin   10 mg Oral q1800   bisacodyl   10 mg Rectal QODAY   Chlorhexidine  Gluconate Cloth  6 each Topical Daily   enoxaparin  (LOVENOX ) injection  40 mg Subcutaneous Q24H   escitalopram   10 mg Oral Daily   lactulose   20 g Oral Daily   losartan   25 mg Oral Daily   melatonin  3 mg Oral QHS   methimazole   5 mg Oral Daily   multivitamin with minerals  1 tablet Oral Daily   polyethylene glycol  17 g Oral Daily   senna-docusate  2 tablet Oral BID   simethicone   120 mg Oral TID   sodium chloride  flush  3 mL Intravenous Q12H   [START ON 10/28/2023] Vitamin D  (Ergocalciferol )  50,000 Units Oral Q Fri   Continuous Infusions:  meropenem  (MERREM ) IV 1 g (10/27/23 1054)   PRN Meds:.acetaminophen  **OR** acetaminophen , hydrALAZINE , ondansetron  **OR** ondansetron  (ZOFRAN ) IV, sodium chloride  flush Allergies  Allergen Reactions   Tape Other (See Comments)    Kinesiology Tape  Rock Tape     OBJECTIVE: Blood pressure (!) 147/92, pulse (!) 120, temperature 97.9 F (36.6 C), resp. rate 18, height 5' 7 (1.702 m), weight 79.9 kg, SpO2 91%.  Physical Exam Constitutional:      Appearance: Normal appearance.  HENT:     Head: Normocephalic and atraumatic.     Right Ear: Tympanic membrane normal.     Left Ear: Tympanic membrane normal.     Nose: Nose normal.     Mouth/Throat:     Mouth: Mucous membranes are moist.  Eyes:     Extraocular Movements: Extraocular movements intact.     Conjunctiva/sclera: Conjunctivae normal.     Pupils: Pupils are equal, round, and reactive  to light.  Cardiovascular:     Rate and Rhythm: Normal rate and regular rhythm.     Heart sounds: No murmur heard.    No friction rub. No gallop.  Pulmonary:     Effort: Pulmonary effort is normal.     Breath sounds: Normal breath sounds.  Abdominal:     General: Abdomen is flat.     Palpations: Abdomen is soft.  Musculoskeletal:        General: Normal range of motion.  Skin:    General: Skin is warm and dry.  Neurological:     General: No focal deficit present.     Mental Status: She is alert and oriented to person, place, and time.  Psychiatric:        Mood and Affect: Mood normal.     Lab Results Lab Results  Component Value Date   WBC 15.7 (H) 10/27/2023   HGB  11.4 (L) 10/27/2023   HCT 38.2 10/27/2023   MCV 93.2 10/27/2023   PLT 353 10/27/2023    Lab Results  Component Value Date   CREATININE 0.64 10/27/2023   BUN 15 10/27/2023   NA 140 10/27/2023   K 3.9 10/27/2023   CL 103 10/27/2023   CO2 24 10/27/2023    Lab Results  Component Value Date   ALT 9 10/25/2023   AST 11 (L) 10/25/2023   ALKPHOS 62 10/25/2023   BILITOT 0.8 10/25/2023       Loney Stank, MD [facility] 10/27/2023, 2:53 PM Evaluation of this patient requires complex antimicrobial therapy evaluation and counseling + isolation needs for disease transmission risk assessment and mitigation

## 2023-10-27 NOTE — Progress Notes (Signed)
 In error

## 2023-10-27 NOTE — Progress Notes (Signed)
 PHARMACY CONSULT NOTE FOR:  OUTPATIENT  PARENTERAL ANTIBIOTIC THERAPY (OPAT)  Indication: ESBL E coli UTI Regimen: Ertapenem  1g IV q24h End date: 11/10/23  IV antibiotic discharge orders are pended. To discharging provider:  please sign these orders via discharge navigator,  Select New Orders & click on the button choice - Manage This Unsigned Work.     Thank you for allowing pharmacy to be a part of this patient's care.  Izetta Carl, PharmD PGY1 Pharmacy Resident [facility]  10/27/2023 2:34 PM

## 2023-10-28 DIAGNOSIS — N390 Urinary tract infection, site not specified: Secondary | ICD-10-CM | POA: Diagnosis not present

## 2023-10-28 DIAGNOSIS — A419 Sepsis, unspecified organism: Secondary | ICD-10-CM | POA: Diagnosis not present

## 2023-10-28 MED ORDER — ERTAPENEM IV (FOR PTA / DISCHARGE USE ONLY): 1.0000 g | INTRAVENOUS | 0 refills | Status: AC

## 2023-10-28 NOTE — Progress Notes (Signed)
 Gave report to [facility] RN danielle.

## 2023-10-28 NOTE — Plan of Care (Signed)

## 2023-10-28 NOTE — Discharge Summary (Signed)
 Physician Discharge Summary  Claude Swendsen [facility] FMW:[id number] DOB: May 04, 1965 DOA: 10/25/2023  PCP: Yolande Toribio MATSU, MD  Admit date: 10/25/2023 Discharge date: 10/28/2023  Admitted From: Skilled nursing facility  Discharge disposition: Skilled nursing facility   Recommendations for Outpatient Follow-Up:   Follow up with your primary care provider at the skilled nursing facility in 3 to 5 days Check CBC, BMP, magnesium in the next visit Patient will need [facility] Dr. Alvaro ( follow-up for stents, renal masses) Follow-up with infectious disease clinic Dr Dennise as has been scheduled on 11/23/23. Patient would benefit from a thyroid  nodule follow-up as outpatient.   Discharge Diagnosis:   Principal Problem:   Sepsis secondary to UTI [facility]) Active Problems:   Charcot Marie Tooth muscular atrophy   Spinocerebellar ataxia (HCC)   Poor vision   Renal calculus   Hyperthyroidism   Hypokalemia   Renal mass   Thyroid  mass   UTI (urinary tract infection)    Discharge Condition: Improved.  Diet recommendation: Regular  Wound care: None.  Code status: DNR   History of Present Illness:   Patient is a 58 years old female with past medical history of spinal muscular atrophy, hypertension bedbound status from assisted living facility with past medical history presented to the hospital with lethargy and generalized weakness..In the ED, patient was noted to be febrile with a temperature of 100.9 F and was tachycardic and slightly tachypneic. WBC elevation at 12.5 with neutrophilia.  BMP showed significant hypokalemia with potassium of 2.8 urinalysis showed nitrite positive more than 50 RBC and white cells.  Lactate of 0.6.   CT scan of the abdomen and pelvis showed hypodense masslike lesion in the right kidney, cystic and solid mass in the superior pole of the left kidney consistent with renal cell carcinoma, calculus of the left ureteropelvic junction measuring  around 1.2 cm without significant hydronephrosis and calculus in the right renal pelvis.  Urology was consulted, blood cultures and urine cultures were sent from the ED and patient was considered for admission to the hospital for further evaluation and treatment.    Hospital Course:   Following conditions were addressed during hospitalization as listed below,  Sepsis secondary to ESBL E Coli UTI secondary to left ureteropelvic junction calculus  Imaging without significant hydronephrosis.  Seen by urology and has undergone bilateral ureteric stent placement on 10/26/2023.  Blood cultures negative i 3 days but urine cultures showing ESBL E. coli.  Patient was initially on IV Rocephin  which has been changed to meropenem  while in the hospital after discussing with ID.  At this time patient will be prescribed ertapenem  1 g IV daily to complete 2 weeks course.  Patient will follow-up with ID as outpatient.    Significant hypokalemia.  Improved after replacement.  Latest potassium of 3.9   Ureteric stones history of  recurrent UTI Seen by urology and is status post ureteric stent bilaterally.   blood cultures negative so far.  Will continue antibiotic as outpatient.  Patient will need to follow-up with Dr. Christopher urology as outpatient   cystic and solid mass in the left kidney.  Could not rule out renal cell carcinoma.  Urology on board.  Will need follow-up with urology.     Right renal mass.  Will need follow-up with urology.   Hyperlipidemia.  On statin at home.  Will resume on discharge.   Hypertension.  On losartan .  Will resume.   Hyperthyroidism.  On methimazole .  TSH of 0.8  at this time.  Continue.   Incidental thyroid  nodule noted on the CT scan of the chest.  Will need outpatient follow-up.  On methimazole  for hyperthyroidism   History of bedbound status from muscular atrophy. From assisted living facility continue supportive care.  Patient is total care from skilled nursing facility.    Disposition.  At this time, patient is stable for disposition to skilled nursing facility.  Medical Consultants:   Urology ID  Procedures:    Cystoscopy with bilateral ureteric stent placement on 10/25/2023 Subjective:   Today, patient was seen and examined at bedside.  Denies any pain, nausea, vomiting, fever, chills diarrhea.  Has been eating okay.  Patient's sister at bedside.  Discharge Exam:   Vitals:   10/28/23 0526 10/28/23 0758  BP: (!) 156/90 (!) 130/91  Pulse: 85 94  Resp: 18 17  Temp: 97.9 F (36.6 C) 98.2 F (36.8 C)  SpO2: 91% 94%   Vitals:   10/27/23 2034 10/28/23 0020 10/28/23 0526 10/28/23 0758  BP: 122/71 (!) 155/90 (!) 156/90 (!) 130/91  Pulse: (!) 103 (!) 103 85 94  Resp: 15 15 18 17   Temp:   97.9 F (36.6 C) 98.2 F (36.8 C)  TempSrc:      SpO2: 90% (!) 89% 91% 94%  Weight:      Height:        General: Alert awake, not in obvious distress, thin.  Speech today, Communicative, HENT: pupils equally reacting to light,  No scleral pallor or icterus noted. Oral mucosa is moist.  Chest:   Diminished breath sounds bilaterally. No crackles or wheezes.  CVS: S1 &S2 heard. No murmur.  Regular rate and rhythm. Abdomen: Soft, nontender, nondistended.  Bowel sounds are heard.  No costovertebral angle tenderness. Extremities: No cyanosis, weakness of lower extremities. Psych: Alert, awake and oriented, normal mood CNS: Slow speech..  Unable to move lower extremities, weakness of the upper extremities, Skin: Warm and dry.  No rashes noted.  The results of significant diagnostics from this hospitalization (including imaging, microbiology, ancillary and laboratory) are listed below for reference.     Diagnostic Studies:   DG C-Arm 1-60 Min-No Report Result Date: 10/25/2023 Fluoroscopy was utilized by the requesting physician.  No radiographic interpretation.   CT Angio Chest PE W/Cm &/Or Wo Cm Result Date: 10/25/2023 CLINICAL DATA:  PE suspected, versus  pneumonia, lethargy, abdominal pain * Tracking Code: BO * EXAM: CT ANGIOGRAPHY CHEST CT ABDOMEN AND PELVIS WITH CONTRAST TECHNIQUE: Multidetector CT imaging of the chest was performed using the standard protocol during bolus administration of intravenous contrast. Multiplanar CT image reconstructions and MIPs were obtained to evaluate the vascular anatomy. Multidetector CT imaging of the abdomen and pelvis was performed using the standard protocol during bolus administration of intravenous contrast. RADIATION DOSE REDUCTION: This exam was performed according to the departmental dose-optimization program which includes automated exposure control, adjustment of the mA and/or kV according to patient size and/or use of iterative reconstruction technique. CONTRAST:  OMNIPAQUE  IOHEXOL  350 MG/ML SOLN COMPARISON:  None Available. FINDINGS: CT CHEST ANGIOGRAM FINDINGS Cardiovascular: Satisfactory opacification of the pulmonary arteries to the segmental level. No evidence of pulmonary embolism. Normal heart size. No pericardial effusion. Mediastinum/Nodes: No enlarged mediastinal, hilar, or axillary lymph nodes. Hypodense nodule of the left lobe of the thyroid  measuring 2.6 x 2.5 cm, which deflects the trachea rightward. 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 Esophagus without significant findings. Lungs/Pleura: Trace pleural effusions and bibasilar atelectasis or consolidation. Musculoskeletal: No chest  wall abnormality. No acute osseous findings. Review of the MIP images confirms the above findings. CT ABDOMEN PELVIS FINDINGS Hepatobiliary: No solid liver abnormality is seen. Multiple fluid attenuation cysts throughout the liver. No gallstones, gallbladder wall thickening, or biliary dilatation. Pancreas: Unremarkable. No pancreatic ductal dilatation or surrounding inflammatory changes. Spleen: Normal in size without significant abnormality. Adrenals/Urinary Tract: Adrenal glands are unremarkable.  Hypodense masslike lesion in the anterior midportion of the right kidney measuring 3.1 x 2.5 cm (series 2, image 65). Right-sided perinephric fat stranding. Heterogeneously enhancing, mixed solid and cystic appearing mass arising from the peripheral superior pole of the left kidney measuring 1.7 x 1.6 cm (series 2, image 32). Calculus at the left ureteropelvic junction measuring 1.2 cm without significant hydronephrosis (series 7, image 85). Nonobstructive calculus in the dependent right renal pelvis. Bladder is unremarkable. Stomach/Bowel: Stomach is within normal limits. Appendix appears normal. No evidence of bowel wall thickening, distention, or inflammatory changes. Vascular/Lymphatic: No significant vascular findings are present. No enlarged abdominal or pelvic lymph nodes. Reproductive: No mass or other significant abnormality. Other: No abdominal wall hernia or abnormality. No ascites. Musculoskeletal: No acute or significant osseous findings. IMPRESSION: 1. Negative examination for pulmonary embolism. 2. Trace pleural effusions and bibasilar atelectasis or consolidation. 3. Hypodense masslike lesion in the anterior midportion of the right kidney measuring 3.1 x 2.5 cm. Right-sided perinephric fat stranding. This may reflect focal pyelonephritis or alternately a renal mass. Contrast enhanced MRI may be helpful to further characterize. 4. Heterogeneously enhancing, mixed solid and cystic appearing mass arising from the peripheral superior pole of the left kidney measuring 1.7 x 1.6 cm. This is consistent with a renal cell carcinoma. 5. Calculus at the left ureteropelvic junction measuring 1.2 cm without significant hydronephrosis. 6. Nonobstructive calculus in the dependent right renal pelvis. 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 8. Hypodense nodule of the left lobe of the thyroid  measuring 2.6 x 2.5 cm, which deflects the trachea rightward. This can be further characterized by thyroid  ultrasound on a  nonemergent, outpatient basis if and when clinically appropriate. Electronically Signed   By: Marolyn JONETTA Jaksch M.D.   On: 10/25/2023 11:23   CT ABDOMEN PELVIS W CONTRAST Result Date: 10/25/2023 CLINICAL DATA:  PE suspected, versus pneumonia, lethargy, abdominal pain * Tracking Code: BO * EXAM: CT ANGIOGRAPHY CHEST CT ABDOMEN AND PELVIS WITH CONTRAST TECHNIQUE: Multidetector CT imaging of the chest was performed using the standard protocol during bolus administration of intravenous contrast. Multiplanar CT image reconstructions and MIPs were obtained to evaluate the vascular anatomy. Multidetector CT imaging of the abdomen and pelvis was performed using the standard protocol during bolus administration of intravenous contrast. RADIATION DOSE REDUCTION: This exam was performed according to the departmental dose-optimization program which includes automated exposure control, adjustment of the mA and/or kV according to patient size and/or use of iterative reconstruction technique. CONTRAST:  OMNIPAQUE  IOHEXOL  350 MG/ML SOLN COMPARISON:  None Available. FINDINGS: CT CHEST ANGIOGRAM FINDINGS Cardiovascular: Satisfactory opacification of the pulmonary arteries to the segmental level. No evidence of pulmonary embolism. Normal heart size. No pericardial effusion. Mediastinum/Nodes: No enlarged mediastinal, hilar, or axillary lymph nodes. Hypodense nodule of the left lobe of the thyroid  measuring 2.6 x 2.5 cm, which deflects the trachea rightward. 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 Esophagus without significant findings. Lungs/Pleura: Trace pleural effusions and bibasilar atelectasis or consolidation. Musculoskeletal: No chest wall abnormality. No acute osseous findings. Review of the MIP images confirms the above findings. CT ABDOMEN PELVIS FINDINGS Hepatobiliary: No solid liver abnormality is  seen. Multiple fluid attenuation cysts throughout the liver. No gallstones, gallbladder wall thickening, or  biliary dilatation. Pancreas: Unremarkable. No pancreatic ductal dilatation or surrounding inflammatory changes. Spleen: Normal in size without significant abnormality. Adrenals/Urinary Tract: Adrenal glands are unremarkable. Hypodense masslike lesion in the anterior midportion of the right kidney measuring 3.1 x 2.5 cm (series 2, image 65). Right-sided perinephric fat stranding. Heterogeneously enhancing, mixed solid and cystic appearing mass arising from the peripheral superior pole of the left kidney measuring 1.7 x 1.6 cm (series 2, image 32). Calculus at the left ureteropelvic junction measuring 1.2 cm without significant hydronephrosis (series 7, image 85). Nonobstructive calculus in the dependent right renal pelvis. Bladder is unremarkable. Stomach/Bowel: Stomach is within normal limits. Appendix appears normal. No evidence of bowel wall thickening, distention, or inflammatory changes. Vascular/Lymphatic: No significant vascular findings are present. No enlarged abdominal or pelvic lymph nodes. Reproductive: No mass or other significant abnormality. Other: No abdominal wall hernia or abnormality. No ascites. Musculoskeletal: No acute or significant osseous findings. IMPRESSION: 1. Negative examination for pulmonary embolism. 2. Trace pleural effusions and bibasilar atelectasis or consolidation. 3. Hypodense masslike lesion in the anterior midportion of the right kidney measuring 3.1 x 2.5 cm. Right-sided perinephric fat stranding. This may reflect focal pyelonephritis or alternately a renal mass. Contrast enhanced MRI may be helpful to further characterize. 4. Heterogeneously enhancing, mixed solid and cystic appearing mass arising from the peripheral superior pole of the left kidney measuring 1.7 x 1.6 cm. This is consistent with a renal cell carcinoma. 5. Calculus at the left ureteropelvic junction measuring 1.2 cm without significant hydronephrosis. 6. Nonobstructive calculus in the dependent right renal  pelvis. 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 8. Hypodense nodule of the left lobe of the thyroid  measuring 2.6 x 2.5 cm, which deflects the trachea rightward. This can be further characterized by thyroid  ultrasound on a nonemergent, outpatient basis if and when clinically appropriate. Electronically Signed   By: Marolyn JONETTA Jaksch M.D.   On: 10/25/2023 11:23   DG Chest 2 View Result Date: 10/25/2023 CLINICAL DATA:  Possible sepsis. EXAM: CHEST - 2 VIEW COMPARISON:  None Available. FINDINGS: The heart size and mediastinal contours are within normal limits. Both lungs are clear. The visualized skeletal structures are unremarkable. IMPRESSION: No active cardiopulmonary disease. Electronically Signed   By: Lynwood Landy Raddle M.D.   On: 10/25/2023 07:55     Labs:   Basic Metabolic Panel: Recent Labs  Lab 10/25/23 0738 10/26/23 0522 10/27/23 0539  NA 141 138 140  K 2.8* 4.3 3.9  CL 112* 104 103  CO2 20* 21* 24  GLUCOSE 101* 142* 103*  BUN 15 9 15   CREATININE 0.36* 0.54 0.64  CALCIUM  7.2* 9.2 9.5  MG  --  1.9 2.2  PHOS  --  2.5  --    GFR Estimated Creatinine Clearance: 85.4 mL/min (by C-G formula based on SCr of 0.64 mg/dL). Liver Function Tests: Recent Labs  Lab 10/25/23 0738  AST 11*  ALT 9  ALKPHOS 62  BILITOT 0.8  PROT 4.6*  ALBUMIN 2.5*   No results for input(s): LIPASE, AMYLASE in the last 168 hours. No results for input(s): AMMONIA in the last 168 hours. Coagulation profile Recent Labs  Lab 10/25/23 0738  INR 1.0    CBC: Recent Labs  Lab 10/25/23 0738 10/26/23 0522 10/27/23 0539  WBC 12.5* 12.2* 15.7*  NEUTROABS 10.9*  --   --   HGB 10.2* 11.3* 11.4*  HCT 33.1* 37.9 38.2  MCV 94.0 92.9  93.2  PLT 248 288 353   Cardiac Enzymes: No results for input(s): CKTOTAL, CKMB, CKMBINDEX, TROPONINI in the last 168 hours. BNP: Invalid input(s): POCBNP CBG: No results for input(s): GLUCAP in the last 168 hours. D-Dimer No results for input(s): DDIMER in  the last 72 hours. Hgb A1c No results for input(s): HGBA1C in the last 72 hours. Lipid Profile No results for input(s): CHOL, HDL, LDLCALC, TRIG, CHOLHDL, LDLDIRECT in the last 72 hours. Thyroid  function studies Recent Labs    10/25/23 1437  TSH 0.884   Anemia work up No results for input(s): VITAMINB12, FOLATE, FERRITIN, TIBC, IRON, RETICCTPCT in the last 72 hours. Microbiology Recent Results (from the past 240 hours)  Blood Culture (routine x 2)     Status: None (Preliminary result)   Collection Time: 10/25/23  7:38 AM   Specimen: BLOOD  Result Value Ref Range Status   Specimen Description   Final    BLOOD BLOOD RIGHT ARM Performed at [facility], [address], [place], [place] [address]    Special Requests   Final    BOTTLES DRAWN AEROBIC AND ANAEROBIC Blood Culture adequate volume Performed at [facility], [address], [place], [place] [address]    Culture   Final    NO GROWTH 3 DAYS Performed at [facility], [address] [place], [place] [address]    Report Status PENDING  Incomplete  Urine Culture     Status: Abnormal   Collection Time: 10/25/23  7:38 AM   Specimen: Urine, Random  Result Value Ref Range Status   Specimen Description   Final    URINE, RANDOM Performed at [facility], [address], [place], [place] [address]    Special Requests   Final    NONE Reflexed from [id number] Performed at [facility], [address], [place], [place] [address]    Culture (A)  Final    >=100,000 COLONIES/mL ESCHERICHIA COLI Confirmed Extended Spectrum Beta-Lactamase Producer (ESBL).  In bloodstream infections from ESBL organisms, carbapenems are preferred over piperacillin/tazobactam. They are shown to have a lower risk of mortality.    Report Status 10/27/2023 FINAL  Final   Organism ID, Bacteria ESCHERICHIA COLI (A)  Final      Susceptibility    Escherichia coli - MIC*    AMPICILLIN >=32 RESISTANT Resistant     CEFAZOLIN (URINE) Value in next row Resistant      >=32 RESISTANTThis is a modified FDA-approved test that has been validated and its performance characteristics determined by the reporting laboratory.  This laboratory is certified under the Clinical Laboratory Improvement Amendments CLIA as qualified to perform high complexity clinical laboratory testing.    CEFEPIME Value in next row Resistant      >=32 RESISTANTThis is a modified FDA-approved test that has been validated and its performance characteristics determined by the reporting laboratory.  This laboratory is certified under the Clinical Laboratory Improvement Amendments CLIA as qualified to perform high complexity clinical laboratory testing.    ERTAPENEM  Value in next row Sensitive      >=32 RESISTANTThis is a modified FDA-approved test that has been validated and its performance characteristics determined by the reporting laboratory.  This laboratory is certified under the Clinical Laboratory Improvement Amendments CLIA as qualified to perform high complexity clinical laboratory testing.    CEFTRIAXONE  Value in next row Resistant      >=32 RESISTANTThis is a modified FDA-approved test that has been  validated and its performance characteristics determined by the reporting laboratory.  This laboratory is certified under the Clinical Laboratory Improvement Amendments CLIA as qualified to perform high complexity clinical laboratory testing.    CIPROFLOXACIN Value in next row Resistant      >=32 RESISTANTThis is a modified FDA-approved test that has been validated and its performance characteristics determined by the reporting laboratory.  This laboratory is certified under the Clinical Laboratory Improvement Amendments CLIA as qualified to perform high complexity clinical laboratory testing.    GENTAMICIN Value in next row Sensitive      >=32 RESISTANTThis is a modified  FDA-approved test that has been validated and its performance characteristics determined by the reporting laboratory.  This laboratory is certified under the Clinical Laboratory Improvement Amendments CLIA as qualified to perform high complexity clinical laboratory testing.    NITROFURANTOIN Value in next row Sensitive      >=32 RESISTANTThis is a modified FDA-approved test that has been validated and its performance characteristics determined by the reporting laboratory.  This laboratory is certified under the Clinical Laboratory Improvement Amendments CLIA as qualified to perform high complexity clinical laboratory testing.    TRIMETH/SULFA Value in next row Resistant      >=32 RESISTANTThis is a modified FDA-approved test that has been validated and its performance characteristics determined by the reporting laboratory.  This laboratory is certified under the Clinical Laboratory Improvement Amendments CLIA as qualified to perform high complexity clinical laboratory testing.    AMPICILLIN/SULBACTAM Value in next row Resistant      >=32 RESISTANTThis is a modified FDA-approved test that has been validated and its performance characteristics determined by the reporting laboratory.  This laboratory is certified under the Clinical Laboratory Improvement Amendments CLIA as qualified to perform high complexity clinical laboratory testing.    PIP/TAZO Value in next row Sensitive ug/mL     <=4 SENSITIVEThis is a modified FDA-approved test that has been validated and its performance characteristics determined by the reporting laboratory.  This laboratory is certified under the Clinical Laboratory Improvement Amendments CLIA as qualified to perform high complexity clinical laboratory testing.    MEROPENEM  Value in next row Sensitive      <=4 SENSITIVEThis is a modified FDA-approved test that has been validated and its performance characteristics determined by the reporting laboratory.  This laboratory is certified  under the Clinical Laboratory Improvement Amendments CLIA as qualified to perform high complexity clinical laboratory testing.    * >=100,000 COLONIES/mL ESCHERICHIA COLI  Resp panel by RT-PCR (RSV, Flu A&B, Covid) Anterior Nasal Swab     Status: None   Collection Time: 10/25/23  7:47 AM   Specimen: Anterior Nasal Swab  Result Value Ref Range Status   SARS Coronavirus 2 by RT PCR NEGATIVE NEGATIVE Final    Comment: (NOTE) SARS-CoV-2 target nucleic acids are NOT DETECTED.  The SARS-CoV-2 RNA is generally detectable in upper respiratory specimens during the acute phase of infection. The lowest concentration of SARS-CoV-2 viral copies this assay can detect is 138 copies/mL. A negative result does not preclude SARS-Cov-2 infection and should not be used as the sole basis for treatment or other patient management decisions. A negative result may occur with  improper specimen collection/handling, submission of specimen other than nasopharyngeal swab, presence of viral mutation(s) within the areas targeted by this assay, and inadequate number of viral copies(<138 copies/mL). A negative result must be combined with clinical observations, patient history, and epidemiological information. The expected result is Negative.  Fact Sheet for  Patients:  BloggerCourse.com  Fact Sheet for Healthcare Providers:  SeriousBroker.it  This test is no t yet approved or cleared by the [place]  FDA and  has been authorized for detection and/or diagnosis of SARS-CoV-2 by FDA under an Emergency Use Authorization (EUA). This EUA will remain  in effect (meaning this test can be used) for the duration of the COVID-19 declaration under Section 564(b)(1) of the Act, 21 U.S.C.section 360bbb-3(b)(1), unless the authorization is terminated  or revoked sooner.       Influenza A by PCR NEGATIVE NEGATIVE Final   Influenza B by PCR NEGATIVE NEGATIVE Final     Comment: (NOTE) The Xpert Xpress SARS-CoV-2/FLU/RSV plus assay is intended as an aid in the diagnosis of influenza from Nasopharyngeal swab specimens and should not be used as a sole basis for treatment. Nasal washings and aspirates are unacceptable for Xpert Xpress SARS-CoV-2/FLU/RSV testing.  Fact Sheet for Patients: BloggerCourse.com  Fact Sheet for Healthcare Providers: SeriousBroker.it  This test is not yet approved or cleared by the [place]  FDA and has been authorized for detection and/or diagnosis of SARS-CoV-2 by FDA under an Emergency Use Authorization (EUA). This EUA will remain in effect (meaning this test can be used) for the duration of the COVID-19 declaration under Section 564(b)(1) of the Act, 21 U.S.C. section 360bbb-3(b)(1), unless the authorization is terminated or revoked.     Resp Syncytial Virus by PCR NEGATIVE NEGATIVE Final    Comment: (NOTE) Fact Sheet for Patients: BloggerCourse.com  Fact Sheet for Healthcare Providers: SeriousBroker.it  This test is not yet approved or cleared by the [place]  FDA and has been authorized for detection and/or diagnosis of SARS-CoV-2 by FDA under an Emergency Use Authorization (EUA). This EUA will remain in effect (meaning this test can be used) for the duration of the COVID-19 declaration under Section 564(b)(1) of the Act, 21 U.S.C. section 360bbb-3(b)(1), unless the authorization is terminated or revoked.  Performed at [facility], [address], [place], [place] [address]   Blood Culture (routine x 2)     Status: None (Preliminary result)   Collection Time: 10/25/23  7:48 AM   Specimen: BLOOD  Result Value Ref Range Status   Specimen Description   Final    BLOOD BLOOD LEFT FOREARM Performed at [facility], [address], [place], [place] [address]    Special  Requests   Final    BOTTLES DRAWN AEROBIC AND ANAEROBIC Blood Culture adequate volume Performed at [facility], [address], [place], [place] [address]    Culture   Final    NO GROWTH 3 DAYS Performed at [facility], [address] [place], [place] [address]    Report Status PENDING  Incomplete  MRSA Next Gen by PCR, Nasal     Status: None   Collection Time: 10/25/23  3:34 PM   Specimen: Nasal Mucosa; Nasal Swab  Result Value Ref Range Status   MRSA by PCR Next Gen NOT DETECTED NOT DETECTED Final    Comment: (NOTE) The GeneXpert MRSA Assay (FDA approved for NASAL specimens only), is one component of a comprehensive MRSA colonization surveillance program. It is not intended to diagnose MRSA infection nor to guide or monitor treatment for MRSA infections. Test performance is not FDA approved in patients less than 25 years old. Performed at [facility], [address] [place], [place] [address]      Discharge Instructions:   Discharge Instructions  [facility] pharmacist to adjust dose for Vancomycin, Aminoglycosides and other anti-infective therapies as requested by physician.   Complete by: As directed    [facility] to provide Cath Flo 2mg    Complete by: As directed    Administer for PICC line occlusion and as ordered by physician for other access device issues.   Anaphylaxis Kit: Provided to treat any anaphylactic reaction to the medication being provided to the patient if First Dose or when requested by physician   Complete by: As directed    Epinephrine 1mg /ml vial / amp: Administer 0.3mg  (0.47ml) subcutaneously once for moderate to severe anaphylaxis, nurse to call physician and pharmacy when reaction occurs and call 911 if needed for immediate care   Diphenhydramine 50mg /ml IV vial: Administer 25-50mg  IV/IM PRN for first dose reaction, rash, itching, mild reaction, nurse to call physician and  pharmacy when reaction occurs   Sodium Chloride  0.9% NS 500ml IV: Administer if needed for hypovolemic blood pressure drop or as ordered by physician after call to physician with anaphylactic reaction   Call MD for:  persistant nausea and vomiting   Complete by: As directed    Call MD for:  temperature >100.4   Complete by: As directed    Change dressing on IV access line weekly and PRN   Complete by: As directed    Diet general   Complete by: As directed    Discharge instructions   Complete by: As directed    Follow-up with your primary care provider at the skilled nursing facility in 3 to 5 days.  Check blood work at that time.  Continue antibiotics as prescribed through PICC line.  Follow-up with infectious disease clinic as scheduled by the clinic on 9/24 with Dr Dennise.  Follow-up with urology clinic for addressing stents.   Flush IV access with Sodium Chloride  0.9% and Heparin 10 units/ml or 100 units/ml   Complete by: As directed    Home infusion instructions - [facility]   Complete by: As directed    Instructions: Flush IV access with Sodium Chloride  0.9% and Heparin 10units/ml or 100units/ml   Change dressing on IV access line: Weekly and PRN   Instructions Cath Flo 2mg : Administer for PICC Line occlusion and as ordered by physician for other access device   [facility] pharmacist to adjust dose for: Vancomycin, Aminoglycosides and other anti-infective therapies as requested by physician   Increase activity slowly   Complete by: As directed    Method of administration may be changed at the discretion of home infusion pharmacist based upon assessment of the patient and/or caregiver's ability to self-administer the medication ordered   Complete by: As directed    No wound care   Complete by: As directed       Allergies as of 10/28/2023       Reactions   Tape Other (See Comments)   Kinesiology Tape  Rock Tape         Medication List     TAKE these  medications    acetaminophen  500 MG tablet Commonly known as: TYLENOL  Take 1,000 mg by mouth every 8 (eight) hours.   atorvastatin  10 MG tablet Commonly known as: LIPITOR Take 10 mg by mouth daily.   baclofen  20 MG tablet Commonly known as: LIORESAL  Take 20 mg by mouth 3 (three) times daily.   BIOFREEZE EX Apply 1 Application topically 4 (four) times daily. To back   bisacodyl  10 MG suppository Commonly known  as: DULCOLAX Place 10 mg rectally every other day.   ergocalciferol  1.25 MG (50000 UT) capsule Commonly known as: VITAMIN D2 Take 50,000 Units by mouth every Friday.   ertapenem  IVPB Commonly known as: INVANZ  Inject 1 g into the vein daily for 14 days. Indication:  ESBL E coli UTI First Dose: Yes Last Day of Therapy:  11/10/23 Labs - Once weekly:  CBC/D and BMP, Labs - Once weekly: ESR and CRP Method of administration: Mini-Bag Plus / Gravity Method of administration may be changed at the discretion of home infusion pharmacist based upon assessment of the patient and/or caregiver's ability to self-administer the medication ordered.   escitalopram  10 MG tablet Commonly known as: LEXAPRO  Take 10 mg by mouth daily.   lactulose  10 GM/15ML solution Commonly known as: CHRONULAC  Take 20 g by mouth daily.   losartan  25 MG tablet Commonly known as: COZAAR  Take 25 mg by mouth daily.   melatonin 3 MG Tabs tablet Take 3 mg by mouth at bedtime.   meloxicam 15 MG tablet Commonly known as: MOBIC Take 15 mg by mouth daily.   methimazole  5 MG tablet Commonly known as: TAPAZOLE  Take 5 mg by mouth daily.   MULTIVITAMIN PO Take 1 tablet by mouth daily.   ondansetron  4 MG tablet Commonly known as: ZOFRAN  Take 4 mg by mouth every 6 (six) hours as needed for nausea or vomiting.   polyethylene glycol 17 g packet Commonly known as: MIRALAX  / GLYCOLAX  Take 17 g by mouth daily.   sennosides-docusate sodium  8.6-50 MG tablet Commonly known as: SENOKOT-S Take 2 tablets by  mouth 2 (two) times daily.   simethicone  125 MG chewable tablet Commonly known as: MYLICON Chew 125 mg by mouth 3 (three) times daily.   temazepam 7.5 MG capsule Commonly known as: RESTORIL Take 7.5 mg by mouth at bedtime.               Discharge Care Instructions  (From admission, onward)           Start     Ordered   10/28/23 0000  Change dressing on IV access line weekly and PRN  (Home infusion instructions - [facility] )        10/28/23 0738            Contact information for follow-up providers     Manny, Ricardo KATHEE Raddle., MD Follow up in 1 week(s).   Specialty: Urology Why: for stents, renal mass followup. call clinic if you dont hear from clinic in few days Contact information: [address] [place] [address] [phone]         Dennise Kingsley, MD. Go to.   Specialty: Infectious Diseases Why: antibiotic follow up on 9/24 Contact information: [address], [address] [place] [address] [phone]              Contact information for after-discharge care     Destination     [facility], [place] .   Service: Skilled Nursing Contact information: [address] [place]  [address] [phone]                      Time coordinating discharge: 39 minutes  Signed:  Eriq Hufford  [facility] 10/28/2023, 12:15 PM

## 2023-10-28 NOTE — TOC Transition Note (Signed)
 Transition of Care [facility]) - Discharge Note   Patient Details  Name: Sharon Bruce MRN: [id number] Date of Birth: [birth year]  Transition of Care [facility]) CM/SW Contact:  Tawni CHRISTELLA Eva, LCSW Phone Number: 10/28/2023, 12:32 PM   Clinical Narrative:    Pt to d/c back to pt's LTC facility [facility] [place]. Pt's room [address]B , RN to call report to [phone]. CSW spoke with pt's sister who arres with d/c plan. PTAR called , IP Care management sign off.    Final next level of care: Long Term Nursing Home Barriers to Discharge: Barriers Resolved   Patient Goals and CMS Choice Patient states their goals for this hospitalization and ongoing recovery are:: RETRUN TO LTC FACILITY CMS Medicare.gov Compare Post Acute Care list provided to:: Patient Choice offered to / list presented to : Patient [place] ownership interest in [facility].provided to:: Patient    Discharge Placement                Patient to be transferred to facility by: EMS Name of family member notified: Sharon Bruce (Sister)  [phone] [facility]) Patient and family notified of of transfer: 10/28/23  Discharge Plan and Services Additional resources added to the After Visit Summary for     Discharge Planning Services: CM Consult Post Acute Care Choice: Durable Medical Equipment [facility])                               Social Drivers of Health (SDOH) Interventions SDOH Screenings   Food Insecurity: No Food Insecurity (10/25/2023)  Housing: Low Risk  (10/25/2023)  Transportation Needs: No Transportation Needs (10/25/2023)  Utilities: Not At Risk (10/25/2023)  Tobacco Use: Unknown (10/25/2023)     Readmission Risk Interventions     No data to display

## 2023-10-30 LAB — CULTURE, BLOOD (ROUTINE X 2)
Culture: NO GROWTH
Culture: NO GROWTH
Special Requests: ADEQUATE
Special Requests: ADEQUATE

## 2023-11-01 ENCOUNTER — Other Ambulatory Visit: Payer: Self-pay | Admitting: Urology

## 2023-11-16 ENCOUNTER — Encounter (HOSPITAL_COMMUNITY): Payer: Self-pay | Admitting: Urology

## 2023-11-16 NOTE — Progress Notes (Signed)
 For Anesthesia: PCP - Toribio KANDICE Shan, MD  Cardiologist - N/A  Bowel Prep reminder:N/A  Chest x-ray - 8/26 & 10/26/23 in [facility] EKG - 10/25/23 in [facility] Stress Test - N/A ECHO - N/A Cardiac Cath - N/A Pacemaker/ICD device last checked:N/A Pacemaker orders received:N/A Device Rep notified:N/A  Spinal Cord Stimulator:N/A  Sleep Study - N/A CPAP - N/A  Fasting Blood Sugar - N/A Checks Blood Sugar ___N/A__ times a day Date and result of last Hgb A1c-N/A  Last dose of GLP1 agonist- N/A GLP1 instructions: Hold 7 days prior to schedule (Hold 24 hours-daily)   Last dose of SGLT-2 inhibitors- N/A SGLT-2 instructions: Hold 72 hours prior to surgery  Blood Thinner Instructions:N/A Last Dose:N/A Time last taken:N/A  Aspirin Instructions:N/A Last Dose:N/A Time last taken:N/A  Activity level:  Bed bound     Anesthesia review: Spinocerebellar Ataxia, Charcot-Marie-Tooth disease  Patient denies shortness of breath, fever, cough and chest pain at PAT appointment   Patient verbalized understanding of instructions that were reviewed over the telephone.

## 2023-11-16 NOTE — Patient Instructions (Addendum)
 Preop instructions for: Sharon Bruce     Date of Birth: 1965/10/04                   Date of Procedure:  11/23/23  Procedure:  CYSTOSCOPY/URETEROSCOPY/HOLMIUM LASER/STENT PLACEMENT CYSTOSCOPY, WITH RETROGRADE PYELOGRAM     Surgeon: Dr. Ricardo Harrison Facility contact: [facility]     Phone:  [phone]               Health Care POA: RN contact name/phone#:  Ilene LPN                        and Fax #: [phone]   Transportation contact phone#: [facility]  Please send day of procedure:  Current med list  Medications taken the day of procedure (return attached form to hospital) confirm time of nothing by mouth status (return attached form to hospital) Patient Demographic info( to include DNR status, problem list, allergies) Bring Insurance card and picture ID    Time to arrive at [facility]:  0915 AM   Report to: Admitting (On your left hand side)    Do not eat solid food or drink past midnight the night before your procedure.(To include any tube feedings-must be discontinued)   Take these morning medications only with sips of water .(or give through gastrostomy or feeding tube).  Acetaminophen  Baclofen  Lexapro  Methimazole    Stop all vitamins and herbal supplements 7 days before surgery.   Oral Hygiene is also important to reduce your risk of infection.                                    Remember - BRUSH YOUR TEETH THE MORNING OF SURGERY WITH YOUR REGULAR TOOTHPASTE   DENTURES WILL BE REMOVED PRIOR TO SURGERY PLEASE DO NOT APPLY Poly grip OR ADHESIVES!!!  Leave all jewelry and other valuables at place where living( no metal or rings to be worn) No contact lens Women-no make-up, no lotions,perfumes,powders    Any questions day of procedure,call  SHORT STAY-[phone]     Sent from :[facility]                   [phone]                   Fax:[phone]   Sent by :  Beyounce Dickens BSN, RN

## 2023-11-21 ENCOUNTER — Encounter (HOSPITAL_COMMUNITY): Payer: Self-pay | Admitting: Urology

## 2023-11-21 NOTE — Progress Notes (Signed)
 Case: [id number] Date/Time: 11/23/23 1130   Procedures:      CYSTOSCOPY/URETEROSCOPY/HOLMIUM LASER/STENT PLACEMENT (Bilateral)     CYSTOSCOPY, WITH RETROGRADE PYELOGRAM (Bilateral)   Anesthesia type: General   Diagnosis: Kidney stone [N20.0]   Pre-op diagnosis: BILATERAL RENAL STONE   Location: [facility] PROC[facility]   Surgeons: Sharon Ricardo KATHEE Mickey., MD       DISCUSSION: Sharon Bruce is a 58 yo female with PMH of HTN, Charcot Marie Tooth with spinocerebellar ataxia, mild dysphagia, hyperthyroid on methimazole , anemia, depression.  Patient admitted from 8/26-8/29 due to urosepsis with AMS 2/2 bilateral kidney stones and an incidentally found left sided renal mass. She went to the OR on 8/26 with Dr. Alvaro for urgent bilateral stent placement. Now scheduled for definitive tx.  Patient resides at [facility] since 2016 after a fall which led to a hip fx and is s/p repair. 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  Seen by provider at [facility] on 10/30/23. Patient is DNR.   VS: Ht 5' 5 (1.651 m)   Wt 75.1 kg   BMI 27.56 kg/m   PROVIDERS: Sharon Toribio MATSU, MD   LABS: Labs reviewed: Acceptable for surgery. (all labs ordered are listed, but only abnormal results are displayed)  Labs Reviewed - No data to display   IMAGES:  CTA Chest 10/25/23:  IMPRESSION: 1. Negative examination for pulmonary embolism. 2. Trace pleural effusions and bibasilar atelectasis or consolidation. 3. Hypodense masslike lesion in the anterior midportion of the right kidney measuring 3.1 x 2.5 cm. Right-sided perinephric fat stranding. This may reflect focal pyelonephritis or alternately a renal mass. Contrast enhanced MRI may be helpful to further characterize. 4. Heterogeneously enhancing, mixed solid and cystic appearing mass arising from the peripheral superior pole of the left kidney measuring 1.7 x 1.6 cm. This is consistent with a renal  cell carcinoma. 5. Calculus at the left ureteropelvic junction measuring 1.2 cm without significant hydronephrosis. 6. Nonobstructive calculus in the dependent right renal pelvis. 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 8. Hypodense nodule of the left lobe of the thyroid  measuring 2.6 x 2.5 cm, which deflects the trachea rightward. This can be further characterized by thyroid  ultrasound on a nonemergent, outpatient basis if and when clinically appropriate.    EKG 10/25/23:  Sinus tachycardia, rate 103 (in ED for urosepsis) Biatrial enlargement Right ventricular hypertrophy Borderline T abnormalities, anterior leads  CV:  Past Medical History:  Diagnosis Date   Anemia    Charcot Marie Tooth muscular atrophy    Closed right hip fracture (HCC) 12/28/2014   Constipation    Depression    Dysphagia    Mild   Hyperlipidemia    Hypertension    Hyperthyroidism    Insomnia    q    Renal mass    Left   Sepsis secondary to UTI (HCC) 10/25/2023   Spinocerebellar ataxia (HCC)    Thyroid  nodule     Past Surgical History:  Procedure Laterality Date   CYSTOSCOPY W/ URETERAL STENT PLACEMENT Bilateral 10/25/2023   Procedure: CYSTOSCOPY, WITH RETROGRADE PYELOGRAM AND URETERAL STENT INSERTION;  Surgeon: Sharon Ricardo KATHEE Mickey., MD;  Location: [facility];  Service: Urology;  Laterality: Bilateral;   GREAT TOE ARTHRODESIS, INTERPHALANGEAL JOINT Bilateral    Childhood   HEMIARTHROPLASTY (BIPOLAR), HIP, DIRECT ANTERIOR APPROACH, FOR FRACTURE Right 12/28/2014   HERNIA REPAIR     as a child, groin hernia   ORIF HIP FRACTURE      MEDICATIONS:  No current facility-administered medications for this encounter.    acetaminophen  (TYLENOL ) 500 MG tablet   atorvastatin  (LIPITOR) 10 MG tablet   baclofen  (LIORESAL ) 20 MG tablet   bisacodyl  (DULCOLAX) 10 MG suppository   ergocalciferol  (VITAMIN D2) 1.25 MG (50000 UT) capsule   escitalopram  (LEXAPRO ) 10 MG tablet   lactulose  (CHRONULAC ) 10 GM/15ML  solution   losartan  (COZAAR ) 25 MG tablet   melatonin 3 MG TABS tablet   meloxicam (MOBIC) 15 MG tablet   Menthol, Topical Analgesic, (BIOFREEZE EX)   methimazole  (TAPAZOLE ) 5 MG tablet   Multiple Vitamin (MULTIVITAMIN PO)   ondansetron  (ZOFRAN ) 4 MG tablet   polyethylene glycol (MIRALAX  / GLYCOLAX ) 17 g packet   sennosides-docusate sodium  (SENOKOT-S) 8.6-50 MG tablet   simethicone  (MYLICON) 125 MG chewable tablet   temazepam (RESTORIL) 7.5 MG capsule    Burnard CHRISTELLA Senna, PA-C [facility] Short Stay/Anesthesiology [facility] Phone [phone] 11/21/2023 8:47 AM

## 2023-11-21 NOTE — Anesthesia Preprocedure Evaluation (Signed)
 Anesthesia Evaluation  Patient identified by MRN, date of birth, ID band Patient awake    Reviewed: Allergy & Precautions, NPO status , Patient's Chart, lab work & pertinent test results  History of Anesthesia Complications Negative for: history of anesthetic complications  Airway Mallampati: I  TM Distance: >3 FB Neck ROM: Full    Dental  (+) Missing, Dental Advisory Given   Pulmonary neg pulmonary ROS   breath sounds clear to auscultation       Cardiovascular hypertension, Pt. 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  Neuromuscular disease (spinocerebellar ataxia)    GI/Hepatic negative GI ROS, Neg liver ROS,,,  Endo/Other   Hyperthyroidism   Renal/GU stones     Musculoskeletal  (+) Arthritis ,    Abdominal   Peds  Hematology negative hematology ROS (+)   Anesthesia Other Findings   Reproductive/Obstetrics                              Anesthesia Physical Anesthesia Plan  ASA: 3  Anesthesia Plan: General   Post-op Pain Management: Tylenol  PO (pre-op)*   Induction: Intravenous  PONV Risk Score and Plan: 3 and Ondansetron , Dexamethasone  and Scopolamine  patch - Pre-op  Airway Management Planned: LMA  Additional Equipment:   Intra-op Plan:   Post-operative Plan:   Informed Consent: I have reviewed the patients History and Physical, chart, labs and discussed the procedure including the risks, benefits and alternatives for the proposed anesthesia with the patient or authorized representative who has indicated his/her understanding and acceptance.   Patient has DNR.  Discussed DNR with patient, Suspend DNR and Discussed DNR with power of attorney.   Dental advisory given and Consent reviewed with POA  Plan Discussed with: CRNA and  Surgeon  Anesthesia Plan Comments: (See PAT note from 9/22 Discussed with patient and her sister)         Anesthesia Quick Evaluation

## 2023-11-23 ENCOUNTER — Other Ambulatory Visit: Payer: Self-pay

## 2023-11-23 ENCOUNTER — Encounter (HOSPITAL_COMMUNITY): Payer: Self-pay | Admitting: Urology

## 2023-11-23 ENCOUNTER — Ambulatory Visit (HOSPITAL_COMMUNITY)
Admission: RE | Admit: 2023-11-23 | Discharge: 2023-11-23 | Disposition: A | Discharge status: Skilled Nursing Facility | Attending: Urology | Admitting: Urology

## 2023-11-23 ENCOUNTER — Inpatient Hospital Stay: Payer: Self-pay | Admitting: Internal Medicine

## 2023-11-23 ENCOUNTER — Encounter (HOSPITAL_COMMUNITY)
Admission: RE | Disposition: A | Discharge status: Skilled Nursing Facility | Payer: Self-pay | Source: Home / Self Care | Attending: Urology

## 2023-11-23 ENCOUNTER — Ambulatory Visit (HOSPITAL_COMMUNITY)

## 2023-11-23 ENCOUNTER — Ambulatory Visit (HOSPITAL_BASED_OUTPATIENT_CLINIC_OR_DEPARTMENT_OTHER): Admitting: Medical

## 2023-11-23 ENCOUNTER — Ambulatory Visit (HOSPITAL_COMMUNITY): Admitting: Medical

## 2023-11-23 DIAGNOSIS — R532 Functional quadriplegia: Secondary | ICD-10-CM | POA: Insufficient documentation

## 2023-11-23 DIAGNOSIS — J90 Pleural effusion, not elsewhere classified: Secondary | ICD-10-CM | POA: Diagnosis not present

## 2023-11-23 DIAGNOSIS — E785 Hyperlipidemia, unspecified: Secondary | ICD-10-CM | POA: Diagnosis not present

## 2023-11-23 DIAGNOSIS — F32A Depression, unspecified: Secondary | ICD-10-CM | POA: Diagnosis not present

## 2023-11-23 DIAGNOSIS — I10 Essential (primary) hypertension: Secondary | ICD-10-CM | POA: Insufficient documentation

## 2023-11-23 DIAGNOSIS — E0590 Thyrotoxicosis, unspecified without thyrotoxic crisis or storm: Secondary | ICD-10-CM | POA: Diagnosis not present

## 2023-11-23 DIAGNOSIS — G121 Other inherited spinal muscular atrophy: Secondary | ICD-10-CM | POA: Insufficient documentation

## 2023-11-23 DIAGNOSIS — N200 Calculus of kidney: Secondary | ICD-10-CM

## 2023-11-23 DIAGNOSIS — Z7401 Bed confinement status: Secondary | ICD-10-CM | POA: Insufficient documentation

## 2023-11-23 DIAGNOSIS — E039 Hypothyroidism, unspecified: Secondary | ICD-10-CM

## 2023-11-23 DIAGNOSIS — R2689 Other abnormalities of gait and mobility: Secondary | ICD-10-CM | POA: Insufficient documentation

## 2023-11-23 HISTORY — DX: Other hereditary ataxias: G11.8

## 2023-11-23 HISTORY — DX: Anemia, unspecified: D64.9

## 2023-11-23 HISTORY — DX: Chronic pain syndrome: G89.4

## 2023-11-23 HISTORY — DX: Dysphagia, unspecified: R13.10

## 2023-11-23 HISTORY — DX: Nontoxic single thyroid nodule: E04.1

## 2023-11-23 HISTORY — PX: CYSTOSCOPY W/ RETROGRADES: SHX1426

## 2023-11-23 HISTORY — DX: Other specified disorders of kidney and ureter: N28.89

## 2023-11-23 HISTORY — DX: Depression, unspecified: F32.A

## 2023-11-23 HISTORY — PX: CYSTOSCOPY/URETEROSCOPY/HOLMIUM LASER/STENT PLACEMENT: SHX6546

## 2023-11-23 HISTORY — DX: Constipation, unspecified: K59.00

## 2023-11-23 HISTORY — DX: Insomnia, unspecified: G47.00

## 2023-11-23 SURGERY — CYSTOSCOPY/URETEROSCOPY/HOLMIUM LASER/STENT PLACEMENT
Anesthesia: General | Laterality: Bilateral

## 2023-11-23 MED ORDER — ORAL CARE MOUTH RINSE: 15.0000 mL | Freq: Once | OROMUCOSAL | Status: AC

## 2023-11-23 MED ORDER — FENTANYL CITRATE (PF) 100 MCG/2ML IJ SOLN
INTRAMUSCULAR | Status: AC
  Filled 2023-11-23: qty 2

## 2023-11-23 MED ORDER — EPHEDRINE SULFATE-NACL 50-0.9 MG/10ML-% IV SOSY
PREFILLED_SYRINGE | INTRAVENOUS | Status: DC | PRN
Start: 2023-11-23 — End: 2023-11-23
  Administered 2023-11-23: 10 mg via INTRAVENOUS

## 2023-11-23 MED ORDER — LIDOCAINE HCL (PF) 2 % IJ SOLN
INTRAMUSCULAR | Status: AC
  Filled 2023-11-23: qty 5

## 2023-11-23 MED ORDER — TRAMADOL HCL 50 MG PO TABS: 50.0000 mg | ORAL_TABLET | Freq: Four times a day (QID) | ORAL | 0 refills | Status: AC | PRN

## 2023-11-23 MED ORDER — PROPOFOL 10 MG/ML IV BOLUS
INTRAVENOUS | Status: AC
  Filled 2023-11-23: qty 20

## 2023-11-23 MED ORDER — ONDANSETRON HCL 4 MG/2ML IJ SOLN
INTRAMUSCULAR | Status: AC
  Filled 2023-11-23: qty 2

## 2023-11-23 MED ORDER — MEPERIDINE HCL 100 MG/ML IJ SOLN: 6.2500 mg | INTRAMUSCULAR | Status: DC | PRN

## 2023-11-23 MED ORDER — FENTANYL CITRATE (PF) 100 MCG/2ML IJ SOLN
INTRAMUSCULAR | Status: DC | PRN
  Administered 2023-11-23: 100 ug via INTRAVENOUS

## 2023-11-23 MED ORDER — LACTATED RINGERS IV SOLN
INTRAVENOUS | Status: DC
Start: 2023-11-23 — End: 2023-11-23

## 2023-11-23 MED ORDER — MIDAZOLAM HCL 2 MG/2ML IJ SOLN
INTRAMUSCULAR | Status: AC
  Filled 2023-11-23: qty 2

## 2023-11-23 MED ORDER — GENTAMICIN SULFATE 40 MG/ML IJ SOLN
5.0000 mg/kg | INTRAVENOUS | Status: AC
  Administered 2023-11-23: 399.6 mg via INTRAVENOUS
  Filled 2023-11-23: qty 10

## 2023-11-23 MED ORDER — PHENYLEPHRINE 80 MCG/ML (10ML) SYRINGE FOR IV PUSH (FOR BLOOD PRESSURE SUPPORT)
PREFILLED_SYRINGE | INTRAVENOUS | Status: DC | PRN
  Administered 2023-11-23 (×2): 80 ug via INTRAVENOUS

## 2023-11-23 MED ORDER — DEXAMETHASONE SODIUM PHOSPHATE 10 MG/ML IJ SOLN
INTRAMUSCULAR | Status: DC | PRN
  Administered 2023-11-23: 10 mg via INTRAVENOUS

## 2023-11-23 MED ORDER — SODIUM CHLORIDE 0.9 % IR SOLN
Status: DC | PRN
  Administered 2023-11-23: 3000 mL via INTRAVESICAL

## 2023-11-23 MED ORDER — ONDANSETRON HCL 4 MG/2ML IJ SOLN
INTRAMUSCULAR | Status: DC | PRN
  Administered 2023-11-23: 4 mg via INTRAVENOUS

## 2023-11-23 MED ORDER — CHLORHEXIDINE GLUCONATE 0.12 % MT SOLN
15.0000 mL | Freq: Once | OROMUCOSAL | Status: AC
  Administered 2023-11-23: 15 mL via OROMUCOSAL

## 2023-11-23 MED ORDER — PROPOFOL 10 MG/ML IV BOLUS
INTRAVENOUS | Status: DC | PRN
  Administered 2023-11-23: 100 mg via INTRAVENOUS

## 2023-11-23 MED ORDER — SCOPOLAMINE 1 MG/3DAYS TD PT72
1.0000 | MEDICATED_PATCH | TRANSDERMAL | Status: DC
  Administered 2023-11-23: 1 mg via TRANSDERMAL
  Filled 2023-11-23: qty 1

## 2023-11-23 MED ORDER — PHENYLEPHRINE 80 MCG/ML (10ML) SYRINGE FOR IV PUSH (FOR BLOOD PRESSURE SUPPORT)
PREFILLED_SYRINGE | INTRAVENOUS | Status: AC
  Filled 2023-11-23: qty 10

## 2023-11-23 MED ORDER — MIDAZOLAM HCL 2 MG/2ML IJ SOLN: 0.5000 mg | Freq: Once | INTRAMUSCULAR | Status: DC | PRN

## 2023-11-23 MED ORDER — LACTATED RINGERS IV SOLN: INTRAVENOUS | Status: DC | PRN

## 2023-11-23 MED ORDER — OXYCODONE HCL 5 MG/5ML PO SOLN: 5.0000 mg | Freq: Once | ORAL | Status: DC | PRN

## 2023-11-23 MED ORDER — OXYCODONE HCL 5 MG PO TABS: 5.0000 mg | ORAL_TABLET | Freq: Once | ORAL | Status: DC | PRN

## 2023-11-23 MED ORDER — ACETAMINOPHEN 500 MG PO TABS
1000.0000 mg | ORAL_TABLET | Freq: Once | ORAL | Status: AC
  Administered 2023-11-23: 1000 mg via ORAL
  Filled 2023-11-23: qty 2

## 2023-11-23 MED ORDER — LIDOCAINE HCL (PF) 2 % IJ SOLN
INTRAMUSCULAR | Status: DC | PRN
  Administered 2023-11-23: 50 mg via INTRADERMAL

## 2023-11-23 MED ORDER — NITROFURANTOIN MONOHYD MACRO 100 MG PO CAPS: 100.0000 mg | ORAL_CAPSULE | Freq: Two times a day (BID) | ORAL | 0 refills | Status: AC

## 2023-11-23 MED ORDER — FENTANYL CITRATE PF 50 MCG/ML IJ SOSY: 25.0000 ug | PREFILLED_SYRINGE | INTRAMUSCULAR | Status: DC | PRN

## 2023-11-23 MED ORDER — DEXAMETHASONE SODIUM PHOSPHATE 10 MG/ML IJ SOLN
INTRAMUSCULAR | Status: AC
  Filled 2023-11-23: qty 1

## 2023-11-23 SURGICAL SUPPLY — 20 items
BAG URO CATCHER STRL LF (MISCELLANEOUS) ×1 IMPLANT
BASKET LASER NITINOL 1.9FR (BASKET) IMPLANT
BASKET STONE NCOMPASS (UROLOGICAL SUPPLIES) IMPLANT
CATH URETL OPEN END 6FR 70 (CATHETERS) ×1 IMPLANT
CLOTH BEACON ORANGE TIMEOUT ST (SAFETY) ×1 IMPLANT
GLOVE SURG LX STRL 7.5 STRW (GLOVE) ×1 IMPLANT
GOWN STRL REUS W/ TWL XL LVL3 (GOWN DISPOSABLE) ×1 IMPLANT
GUIDEWIRE ANG ZIPWIRE 038X150 (WIRE) ×1 IMPLANT
GUIDEWIRE STR DUAL SENSOR (WIRE) ×1 IMPLANT
KIT TURNOVER KIT A (KITS) ×1 IMPLANT
MANIFOLD NEPTUNE II (INSTRUMENTS) ×1 IMPLANT
NS IRRIG 1000ML POUR BTL (IV SOLUTION) IMPLANT
PACK CYSTO (CUSTOM PROCEDURE TRAY) ×1 IMPLANT
SHEATH NAVIGATOR HD 11/13X28 (SHEATH) IMPLANT
SHEATH NAVIGATOR HD 11/13X36 (SHEATH) IMPLANT
STENT POLARIS 5FRX26 (STENTS) IMPLANT
TRACTIP FLEXIVA PULS ID 200XHI (Laser) IMPLANT
TUBE PU 8FR 16IN ENFIT (TUBING) ×1 IMPLANT
TUBING CONNECTING 10 (TUBING) ×1 IMPLANT
TUBING UROLOGY SET (TUBING) ×1 IMPLANT

## 2023-11-23 NOTE — Transfer of Care (Signed)
 Immediate Anesthesia Transfer of Care Note  Patient: Sharon Bruce  Procedure(s) Performed: CYSTOSCOPY/URETEROSCOPY/HOLMIUM LASER/STENT PLACEMENT (Bilateral) CYSTOSCOPY, WITH RETROGRADE PYELOGRAM (Bilateral)  Patient Location: PACU  Anesthesia Type:General  Level of Consciousness: sedated and responds to stimulation  Airway & Oxygen Therapy: Patient Spontanous Breathing and Patient connected to nasal cannula oxygen  Post-op Assessment: Report given to RN and Post -op Vital signs reviewed and stable  Post vital signs: Reviewed and stable  Last Vitals:  Vitals Value Taken Time  BP 133/79 11/23/23 12:21  Temp    Pulse 84 11/23/23 12:23  Resp 13 11/23/23 12:23  SpO2 100 % 11/23/23 12:23  Vitals shown include unfiled device data.  Last Pain:  Vitals:   11/23/23 0809  TempSrc: Oral         Complications: No notable events documented.

## 2023-11-23 NOTE — H&P (Signed)
 Sharon Bruce is an 58 y.o. female.    Chief Complaint: Pre-Op BILATERAL Ureteroscopic Stone Manipulation  HPI:   1 - Urolithiasis - Lt 11mm UPJ sotne, Rt 6mm renal stone on CT 09/2023 during episode urosepsis. Bilat stent placed 09/2023.  2 - H/o Urosepsis - ESBL e. Coli sepsis 09/2023. Sens gent, nitroF  PMH sig for CMT / bedbound / lives at facility.  Today Sharon Bruce is seen to proceed with BILATERAL ureteroscopy for L>R renal stones with h/o severe GU infections. No interval fevers.     Past Medical History:  Diagnosis Date   Anemia    Charcot Marie Tooth muscular atrophy    Closed right hip fracture (HCC) 12/28/2014   Constipation    Depression    Dysphagia    Mild   Hyperlipidemia    Hypertension    Hyperthyroidism    Insomnia    Renal mass    Left   Sepsis secondary to UTI (HCC) 10/25/2023   Spinocerebellar ataxia (HCC)    Thyroid  nodule     Past Surgical History:  Procedure Laterality Date   CYSTOSCOPY W/ URETERAL STENT PLACEMENT Bilateral 10/25/2023   Procedure: CYSTOSCOPY, WITH RETROGRADE PYELOGRAM AND URETERAL STENT INSERTION;  Surgeon: Alvaro Ricardo KATHEE Mickey., MD;  [facility];  Service: Urology;  Laterality: Bilateral;   GREAT TOE ARTHRODESIS, INTERPHALANGEAL JOINT Bilateral    Childhood   HEMIARTHROPLASTY (BIPOLAR), HIP, DIRECT ANTERIOR APPROACH, FOR FRACTURE Right 12/28/2014   HERNIA REPAIR     as a child, groin hernia   ORIF HIP FRACTURE      Family History  Problem Relation Age of Onset   Hypertension Father    Heart disease Father    Hypertension Brother    Charcot-Marie-Tooth disease Brother    Social History:  reports that she has never smoked. She does not have any smokeless tobacco history on file. She reports that she does not drink alcohol and does not use drugs.  Allergies:  Allergies  Allergen Reactions   Tape Other (See Comments)    Kinesiology Tape  Rock Tape     No medications prior to admission.    No results found  for this or any previous visit (from the past 48 hours). No results found.  Review of Systems  Constitutional:  Negative for chills and fever.  Musculoskeletal:  Positive for gait problem.  Neurological:  Positive for weakness.  All other systems reviewed and are negative.   Height 5' 5 (1.651 m), weight 75.1 kg. Physical Exam Constitutional:      Comments: Appears older than stated age, morbidly obese bed-bound  HENT:     Head: Normocephalic.  Eyes:     Pupils: Pupils are equal, round, and reactive to light.  Cardiovascular:     Rate and Rhythm: Normal rate.  Abdominal:     Comments: Large truncal obesity limits sensitivitgy of exam  Genitourinary:    Comments: No CVAT Musculoskeletal:     Cervical back: Normal range of motion.  Skin:    General: Skin is warm.  Neurological:     General: No focal deficit present.  Psychiatric:        Mood and Affect: Mood normal.      Assessment/Plan  Proceed as planned with BILATERAL ureteroscopy / stent exchange. Risks, benefits, alternatives, expected peri-op course discussed in detail. She understands that her very poor functional status, metabolic and neurologic comorbidity increases risk of ALL peri-opp complications including sepsis and mortality.   Ricardo KATHEE  Alvaro Raddle., MD 11/23/2023, 6:40 AM

## 2023-11-23 NOTE — Op Note (Signed)
 NAME: Sharon Bruce, Sharon Bruce MEDICAL RECORD NO: [id number] ACCOUNT NO: [id number] DATE OF BIRTH: 1965-06-17 FACILITY: [facility] LOCATION: [facility] PHYSICIAN: Ricardo Likens, MD  Operative Report   DATE OF PROCEDURE: 11/23/2023  PREOPERATIVE DIAGNOSES: 1.  Bilateral renal stones. 2.  History of urosepsis.  PROCEDURE PERFORMED: 1.  Cystoscopy with bilateral retrograde pyelograms with interpretation. 2.  Bilateral ureteroscopy with laser lithotripsy. 3.  Exchange of bilateral ureteral stents.  ESTIMATED BLOOD LOSS:  Nil.  COMPLICATIONS:  None.  SPECIMENS:  Bilateral renal stone for composition analysis.  FINDINGS: 1.  Left 4-mm renal pelvis stone. 2.  Right 8-mm renal pelvis stone. 3.  Complete resolution of all accessible stone fragments larger than one-third mm following laser lithotripsy and basket extraction. 4.  Successful placement of bilateral ureteral stents, proximal end in the renal pelvis, distal end in the urinary bladder, with tethers.  INDICATIONS:  The patient is a pleasant but unfortunate 58 year old lady with a history of Charcot-Marie-Tooth syndrome with functional quadriplegia.  She is bedbound at baseline.  She was found on a workup of sepsis to have a left UPJ stone and a right  nonobstructing renal stone in 09/2023.  She underwent stenting at that time in the emergency setting for decompression, IV antibiotics, and cleared infectious parameters.  She now presents for definitive stone management today with uteroscopy.  Informed  consent was obtained and placed in the medical record.  DESCRIPTION OF PROCEDURE:  The patient, being identified and verified.  Procedure being bilateral ureteroscopic stone manipulation was confirmed.  Procedure time out was performed.  Intravenous antibiotics were administered.  General LMA anesthesia was  induced.  The patient was placed into low lithotomy position and a sterile field was created, prepped and draped the patient's vagina,  introitus and proximal thighs using iodine after her in-situ Foley catheter was removed.  Cystourethroscopy was  performed using a 21-French rigid cystoscope with offset lens.  Inspection of urinary bladder revealed no diverticula, calcifications, or papillary lesions.  The distal end of left stent was grasped and removed in its entirety and set aside for discard.  The left ureteral orifice was cannulated with a 6-French end-hole catheter and a left retrograde pyelogram was obtained.  The left retrograde pyelogram demonstrated single left ureter and single system left kidney.  No obvious filling defects or narrowing noted at this point.  The ZIPwire was advanced at the level of lower pole set aside as a safety wire.  Next, the distal  end of the right stent was grasped, brought to the level of the urethral meatus, and exchanged for an open-ended catheter via a ZIPwire, and a right retrograde pyelogram was obtained.  Right retrograde pyelogram demonstrated single right ureter and single system right kidney.  No filling defects or narrowing noted.  The ZIPwire was once again advanced set aside as a safety wire on the right side.  An 8-French feeding tube was placed  into the urinary bladder for pressure release.  Semirigid ureteroscopy was performed in the distal orifice of right ureter alongside as separate sensory working wire.  No mucosal abnormalities were found.  Next, semirigid ureteroscopy was performed in  the distal orifice of the left ureter alongside as separate sensory working wire.  No mucosal abnormalities were found.  The semirigid ureteroscope was then exchanged for a short-length ureteral access sheath to the level of the proximal ureter using continuous fluoroscopic guidance flexible digital ureteroscopy was then performed of the proximal left ureter and systematic  inspection of the left kidney.  As anticipated, there was a dominant stone in the renal pelvis that was free-floating,  likely corresponding to the prior UPJ stone.  This was repositioned into an upper pole calyx to allow for less acute angulation, and  holmium laser energy was applied with settings of 0.2 joules and 40 Hz.  Approximately 50% of the stone was dusted and 50% was fragmented, with the fragments being amenable to basketing with EnCompass basket.  Such that all fragments larger than  one-third mm had been removed.  There were no additional calcifications in the kidney.  The access sheath was removed under continuous vision.  No significant mucosal abnormalities were found.  An access sheath was then placed over the right-sided sensory working wire at the level of the proximal right ureter using continuous fluoroscopic guidance.  Flexible digital ureteroscopy was performed of the proximal ureter.  Systematic inspection of  the right kidney was performed, including all calyces x3.  There was a single dominant intrarenal stone estimated to be approximately 6-8 mm.  It was repositioned to an upper pole calyx and similarly holmium laser energy was applied with settings of 0.2  joules and 40 Hz.  50% of the stone was dusted and 50% fragmented with the fragments being amenable to simple basketing, such that all fragments larger than one-third mm had been removed.  The access sheath was removed under continuous vision.  No  significant mucosal abnormalities were found.  Given the bilateral nature of the procedure and her history of urosepsis, it was felt the interval stenting will clearly be warranted with tethered stents.  A new 5 x 26 Polaris type stent was carefully placed over the safety wire on each side using  fluoroscopic guidance.  Good proximal and distal end were noted.  The tethers were left in place, fashioned together, and trimmed to length.  They were taped to her inner right thigh.  Procedure was terminated.  The patient tolerated the procedure well.  No immediate perioperative complications.  The  patient was taken to the postanesthesia care unit in stable condition.  The plan is for discharge back to her assisted living facility.   PUS D: 11/23/2023 12:12:48 pm T: 11/23/2023 2:29:00 pm  JOB: [id number]/ [id number]

## 2023-11-23 NOTE — Anesthesia Procedure Notes (Signed)
 Procedure Name: LMA Insertion Date/Time: 11/23/2023 10:59 AM  Performed by: Obadiah Reyes BROCKS, CRNAPre-anesthesia Checklist: Patient identified, Emergency Drugs available, Suction available and Patient being monitored Patient Re-evaluated:Patient Re-evaluated prior to induction Oxygen Delivery Method: Circle System Utilized Preoxygenation: Pre-oxygenation with 100% oxygen Induction Type: IV induction Ventilation: Mask ventilation without difficulty LMA: LMA inserted LMA Size: 4.0 Number of attempts: 1 Airway Equipment and Method: Bite block Placement Confirmation: positive ETCO2 Tube secured with: Tape Dental Injury: Teeth and Oropharynx as per pre-operative assessment

## 2023-11-23 NOTE — Discharge Instructions (Addendum)
 1 - You may have urinary urgency (bladder spasms) and bloody urine on / off with stent in place. This is normal.  2 - Remove tethered stents on Friday morning (11/25/23) by pulling on strings, then blue-white plastic tubing, and discarding. There are TWO stents on common string. Office is open Friday if any problems arise.   3 - Call MD or go to ER for fever >102, severe pain / nausea / vomiting not relieved by medications, or acute change in medical status

## 2023-11-23 NOTE — Anesthesia Postprocedure Evaluation (Signed)
 Anesthesia Post Note  Patient: Sharon Bruce  Procedure(s) Performed: CYSTOSCOPY/URETEROSCOPY/HOLMIUM LASER/STENT PLACEMENT (Bilateral) CYSTOSCOPY, WITH RETROGRADE PYELOGRAM (Bilateral)     Patient location during evaluation: PACU Anesthesia Type: General Level of consciousness: awake and alert, oriented and patient cooperative Pain management: pain level controlled Vital Signs Assessment: post-procedure vital signs reviewed and stable Respiratory status: spontaneous breathing, nonlabored ventilation and respiratory function stable Cardiovascular status: blood pressure returned to baseline and stable Postop Assessment: no apparent nausea or vomiting Anesthetic complications: no   No notable events documented.  Last Vitals:  Vitals:   11/23/23 1343 11/23/23 1345  BP:  (!) 148/82  Pulse: 80   Resp:    Temp:    SpO2: 97%     Last Pain:  Vitals:   11/23/23 1345  TempSrc:   PainSc: 0-No pain                 Niharika Savino,E. Sherlock Nancarrow

## 2023-11-23 NOTE — Brief Op Note (Signed)
 11/23/2023  12:07 PM  PATIENT:  Sharon Bruce  58 y.o. female  PRE-OPERATIVE DIAGNOSIS:  BILATERAL RENAL STONE  POST-OPERATIVE DIAGNOSIS:  BILATERAL RENAL STONE  PROCEDURE:  Procedure(s): CYSTOSCOPY/URETEROSCOPY/HOLMIUM LASER/STENT PLACEMENT (Bilateral) CYSTOSCOPY, WITH RETROGRADE PYELOGRAM (Bilateral)  SURGEON:  Surgeons and Role:    * Manny, Ricardo KATHEE Raddle., MD - Primary  PHYSICIAN ASSISTANT:   ASSISTANTS: none   ANESTHESIA:   general  EBL:  minimal   BLOOD ADMINISTERED:none  DRAINS: none   LOCAL MEDICATIONS USED:  NONE  SPECIMEN:  Source of Specimen:  bilateral renal stone fragments  DISPOSITION OF SPECIMEN:  [facility] for compositional analysis  COUNTS:  YES  TOURNIQUET:  * No tourniquets in log *  DICTATION: .Other Dictation: Dictation Number [id number]  PLAN OF CARE: Discharge to home after PACU  PATIENT DISPOSITION:  PACU - hemodynamically stable.   Delay start of Pharmacological VTE agent (>24hrs) due to surgical blood loss or risk of bleeding: yes

## 2023-11-24 ENCOUNTER — Encounter (HOSPITAL_COMMUNITY): Payer: Self-pay | Admitting: Urology

## 2023-12-07 ENCOUNTER — Other Ambulatory Visit: Payer: Self-pay

## 2023-12-07 ENCOUNTER — Ambulatory Visit: Admitting: Internal Medicine

## 2023-12-07 VITALS — BP 130/88 | HR 77 | Temp 98.0°F | Resp 16

## 2023-12-07 DIAGNOSIS — N390 Urinary tract infection, site not specified: Secondary | ICD-10-CM | POA: Diagnosis not present

## 2023-12-07 NOTE — Progress Notes (Signed)
 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 female with past medical history of presents forspinal muscular atrophy, hypertension, hyperlipidemia, bedbound, hypothyroidism hospital follow-up of complicated UTI in the setting of nephrolithiasis.  Urine cultures grew ESBL E. coli.  CT abdomen pelvis showed hypodense masslike lesion right kidney, right-sided perinephric standing, lesion left kidney consider renal cell carcinoma.  Underwent bilateral stent placement urology 8/27.  Plan to follow-up with urology for masslike lesion.  Discharged on ertapenem  to complete 2 weeks from the OR EOT 11/10/2023.  Today: No new complaints.  She is followed by urology.  PICC line is out.  Review of Systems  All other systems reviewed and are negative.   Past Medical History:  Diagnosis Date   Anemia    Charcot Marie Tooth muscular atrophy    Closed right hip fracture (HCC) 12/28/2014   Constipation    Depression    Dysphagia    Mild   Hyperlipidemia    Hypertension    Hyperthyroidism    Insomnia    Renal mass    Left   Sepsis secondary to UTI (HCC) 10/25/2023   Spinocerebellar ataxia (HCC)    Thyroid  nodule     Outpatient Medications Prior to Visit  Medication Sig Dispense Refill   acetaminophen  (TYLENOL ) 325 MG tablet Take 650 mg by mouth every 6 (six) hours as  needed for fever.     acetaminophen  (TYLENOL ) 500 MG tablet Take 1,000 mg by mouth every 8 (eight) hours.     atorvastatin  (LIPITOR) 10 MG tablet Take 10 mg by mouth daily.     baclofen  (LIORESAL ) 20 MG tablet Take 20 mg by mouth 3 (three) times daily.     bisacodyl  (DULCOLAX) 10 MG suppository Place 10 mg rectally every other day.     ergocalciferol  (VITAMIN D2) 1.25 MG (50000 UT) capsule Take 50,000 Units by mouth every Friday.     escitalopram  (LEXAPRO ) 10 MG tablet Take 10 mg by mouth daily.     lactulose  (CHRONULAC ) 10 GM/15ML solution Take 20 g by mouth daily.     lidocaine  (XYLOCAINE ) 1 % (with preservative) injection      losartan  (COZAAR ) 25 MG tablet Take 25 mg by mouth daily.     Magnesium Hydroxide (MILK OF MAGNESIA PO) Take 30 mLs by mouth See admin instructions. Every Wednesday as needed     melatonin 3 MG TABS tablet Take 3 mg by mouth at bedtime.     meloxicam (MOBIC) 15 MG tablet Take 15 mg by mouth daily.     Menthol, Topical Analgesic, (BIOFREEZE EX) Apply 1 Application topically 4 (four) times daily. To back     methimazole  (TAPAZOLE ) 5 MG tablet Take 5 mg by mouth daily.     Multiple Vitamin (MULTIVITAMIN PO) Take 1 tablet by mouth daily.  ondansetron  (ZOFRAN ) 4 MG tablet Take 4 mg by mouth every 6 (six) hours as needed for nausea or vomiting.     polyethylene glycol (MIRALAX  / GLYCOLAX ) 17 g packet Take 17 g by mouth daily.     sennosides-docusate sodium  (SENOKOT-S) 8.6-50 MG tablet Take 2 tablets by mouth 2 (two) times daily.     simethicone  (MYLICON) 125 MG chewable tablet Chew 125 mg by mouth 3 (three) times daily.     temazepam (RESTORIL) 7.5 MG capsule Take 7.5 mg by mouth at bedtime.     traMADol  (ULTRAM ) 50 MG tablet Take 1-2 tablets (50-100 mg total) by mouth every 6 (six) hours as needed for moderate pain (pain score 4-6) or severe pain (pain score 7-10) (post-operatively). 20 tablet 0   No facility-administered medications prior to visit.     Allergies   Allergen Reactions   Tape Other (See Comments)    Kinesiology Tape  Rock Tape     Social History   Tobacco Use   Smoking status: Never  Vaping Use   Vaping status: Never Used  Substance Use Topics   Alcohol use: No    Alcohol/week: 0.0 standard drinks of alcohol   Drug use: No    Family History  Problem Relation Age of Onset   Hypertension Father    Heart disease Father    Hypertension Brother    Charcot-Marie-Tooth disease Brother     Objective:   Vitals:   12/07/23 1052  BP: 130/88  Pulse: 77  Resp: 16  Temp: 98 F (36.7 C)  TempSrc: Oral  SpO2: 99%   There is no height or weight on file to calculate BMI.  Physical Exam Constitutional:      Appearance: Normal appearance.  HENT:     Head: Normocephalic and atraumatic.     Right Ear: Tympanic membrane normal.     Left Ear: Tympanic membrane normal.     Nose: Nose normal.     Mouth/Throat:     Mouth: Mucous membranes are moist.  Eyes:     Extraocular Movements: Extraocular movements intact.     Conjunctiva/sclera: Conjunctivae normal.     Pupils: Pupils are equal, round, and reactive to light.  Cardiovascular:     Rate and Rhythm: Normal rate and regular rhythm.     Heart sounds: No murmur heard.    No friction rub. No gallop.  Pulmonary:     Effort: Pulmonary effort is normal.     Breath sounds: Normal breath sounds.  Abdominal:     General: Abdomen is flat.     Palpations: Abdomen is soft.  Musculoskeletal:        General: Normal range of motion.  Skin:    General: Skin is warm and dry.  Neurological:     General: No focal deficit present.     Mental Status: She is alert and oriented to person, place, and time.  Psychiatric:        Mood and Affect: Mood normal.     Lab Results: Lab Results  Component Value Date   WBC 15.7 (H) 10/27/2023   HGB 11.4 (L) 10/27/2023   HCT 38.2 10/27/2023   MCV 93.2 10/27/2023   PLT 353 10/27/2023    Lab Results  Component Value Date   CREATININE  0.64 10/27/2023   BUN 15 10/27/2023   NA 140 10/27/2023   K 3.9 10/27/2023   CL 103 10/27/2023   CO2 24 10/27/2023    Lab Results  Component  Value Date   ALT 9 10/25/2023   AST 11 (L) 10/25/2023   ALKPHOS 62 10/25/2023   BILITOT 0.8 10/25/2023     Assessment & Plan:  #Complicated UTI with renal stone, renal mass - Urine cultures grew ESBL E. coli - CT showed masslike lesion in right kidney with perinephric stranding, left kidney lesion as well consider renal cell carcinoma.  Plan to follow-up with urology.  Calculus of UPJ without significant hydronephrosis, stone in right pelvis as well.  Taken to the OR for stent placement bilaterally on 8/27.  Plan for follow-up with urology for masslike lesion.  She was just started on ertapenem  to complete 2 weeks antibiotics and PICC placement EOT 11/10/2023. -Taken to the OR on 11/23/2023 for cystoscopy, lithotripsy and exchange of bilateral stents.  Seen by urology on Monday and had signed off per patient. -Pt states she has been off abx since picc out(there is a prescription for Macrobid  x 3 days around OR on 9/24) - Follow-up with ID as needed.  Loney Stank, MD [facility]   12/07/23  11:06 AM  I have personally spent 42 minutes involved in face-to-face and non-face-to-face activities for this patient on the day of the visit. Professional time spent includes the following activities: Preparing to see the patient (review of tests), Obtaining and/or reviewing separately obtained history (admission/discharge record), Performing a medically appropriate examination and/or evaluation , Ordering medications/tests/procedures, referring and communicating with other health care professionals, Documenting clinical information in the EMR, Independently interpreting results (not separately reported), Communicating results to the patient/family/caregiver, Counseling and educating the  patient/family/caregiver and Care coordination (not separately reported).

## 2024-07-09 ENCOUNTER — Ambulatory Visit: Admitting: "Endocrinology
